# Patient Record
Sex: Male | Born: 1980 | Race: Black or African American | Hispanic: No | Marital: Single | Smoking: Current every day smoker
Health system: Southern US, Community
[De-identification: ages and names within clinical notes are randomized; demographics above are authoritative.]

## PROBLEM LIST (undated history)

## (undated) DIAGNOSIS — F32A Depression, unspecified: Secondary | ICD-10-CM

## (undated) DIAGNOSIS — K219 Gastro-esophageal reflux disease without esophagitis: Secondary | ICD-10-CM

## (undated) DIAGNOSIS — F329 Major depressive disorder, single episode, unspecified: Secondary | ICD-10-CM

## (undated) DIAGNOSIS — F419 Anxiety disorder, unspecified: Secondary | ICD-10-CM

## (undated) DIAGNOSIS — G4700 Insomnia, unspecified: Secondary | ICD-10-CM

## (undated) DIAGNOSIS — M419 Scoliosis, unspecified: Secondary | ICD-10-CM

## (undated) HISTORY — DX: Insomnia, unspecified: G47.00

## (undated) HISTORY — DX: Scoliosis, unspecified: M41.9

## (undated) HISTORY — DX: Gastro-esophageal reflux disease without esophagitis: K21.9

## (undated) HISTORY — DX: Anxiety disorder, unspecified: F41.9

## (undated) HISTORY — DX: Depression, unspecified: F32.A

---
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
## 2002-01-14 ENCOUNTER — Emergency Department (HOSPITAL_COMMUNITY): Admission: EM | Admit: 2002-01-14 | Discharge: 2002-01-14 | Payer: Self-pay | Admitting: Emergency Medicine

## 2003-03-30 ENCOUNTER — Emergency Department (HOSPITAL_COMMUNITY): Admission: EM | Admit: 2003-03-30 | Discharge: 2003-03-30 | Payer: Self-pay

## 2004-03-22 ENCOUNTER — Emergency Department (HOSPITAL_COMMUNITY): Admission: EM | Admit: 2004-03-22 | Discharge: 2004-03-22 | Payer: Self-pay | Admitting: Emergency Medicine

## 2005-03-30 ENCOUNTER — Emergency Department (HOSPITAL_COMMUNITY): Admission: EM | Admit: 2005-03-30 | Discharge: 2005-03-30 | Payer: Self-pay | Admitting: Emergency Medicine

## 2005-09-13 ENCOUNTER — Emergency Department (HOSPITAL_COMMUNITY): Admission: EM | Admit: 2005-09-13 | Discharge: 2005-09-13 | Payer: Self-pay | Admitting: Emergency Medicine

## 2005-09-26 ENCOUNTER — Emergency Department (HOSPITAL_COMMUNITY): Admission: EM | Admit: 2005-09-26 | Discharge: 2005-09-26 | Payer: Self-pay | Admitting: Emergency Medicine

## 2005-09-28 ENCOUNTER — Emergency Department (HOSPITAL_COMMUNITY): Admission: EM | Admit: 2005-09-28 | Discharge: 2005-09-28 | Payer: Self-pay | Admitting: Emergency Medicine

## 2006-01-14 ENCOUNTER — Emergency Department (HOSPITAL_COMMUNITY): Admission: EM | Admit: 2006-01-14 | Discharge: 2006-01-14 | Payer: Self-pay | Admitting: Emergency Medicine

## 2006-07-08 ENCOUNTER — Emergency Department (HOSPITAL_COMMUNITY): Admission: EM | Admit: 2006-07-08 | Discharge: 2006-07-08 | Payer: Self-pay | Admitting: Emergency Medicine

## 2008-10-28 ENCOUNTER — Emergency Department (HOSPITAL_COMMUNITY): Admission: EM | Admit: 2008-10-28 | Discharge: 2008-10-28 | Payer: Self-pay | Admitting: Emergency Medicine

## 2010-05-18 LAB — ETHANOL

## 2010-05-18 LAB — CBC
HCT: 39.9 % (ref 39.0–52.0)
Hemoglobin: 13.9 g/dL (ref 13.0–17.0)
MCHC: 34.8 g/dL (ref 30.0–36.0)
MCV: 88.3 fL (ref 78.0–100.0)
Platelets: 288 K/uL (ref 150–400)
RBC: 4.51 MIL/uL (ref 4.22–5.81)
RDW: 12.9 % (ref 11.5–15.5)
WBC: 8.3 K/uL (ref 4.0–10.5)

## 2010-05-18 LAB — COMPREHENSIVE METABOLIC PANEL
ALT: 16 U/L (ref 0–53)
AST: 21 U/L (ref 0–37)
Alkaline Phosphatase: 45 U/L (ref 39–117)
CO2: 32 mEq/L (ref 19–32)
Chloride: 101 mEq/L (ref 96–112)
Creatinine, Ser: 1.22 mg/dL (ref 0.4–1.5)
GFR calc Af Amer: >60 mL/min (ref >60)
GFR calc non Af Amer: >60 mL/min (ref >60)
Potassium: 3.5 mEq/L (ref 3.5–5.1)
Total Bilirubin: 0.7 mg/dL (ref 0.3–1.2)

## 2010-05-18 LAB — DIFFERENTIAL
Basophils Absolute: 0 10*3/uL (ref 0.0–0.1)
Basophils Relative: 0 % (ref 0–1)
Eosinophils Absolute: 0.5 10*3/uL (ref 0.0–0.7)
Eosinophils Relative: 6 % — ABNORMAL HIGH (ref 0–5)
Lymphocytes Relative: 23 % (ref 12–46)
Monocytes Absolute: 0.6 10*3/uL (ref 0.1–1.0)

## 2010-05-18 LAB — LIPASE, BLOOD: Lipase: 23 U/L (ref 11–59)

## 2010-12-21 ENCOUNTER — Encounter: Payer: Self-pay | Admitting: Emergency Medicine

## 2010-12-21 ENCOUNTER — Emergency Department (HOSPITAL_COMMUNITY)
Admission: EM | Admit: 2010-12-21 | Discharge: 2010-12-21 | Disposition: A | Discharge status: Home / Self Care | Payer: Medicaid Other | Attending: Emergency Medicine | Admitting: Emergency Medicine

## 2010-12-21 DIAGNOSIS — H60399 Other infective otitis externa, unspecified ear: Secondary | ICD-10-CM | POA: Insufficient documentation

## 2010-12-21 DIAGNOSIS — F17200 Nicotine dependence, unspecified, uncomplicated: Secondary | ICD-10-CM | POA: Insufficient documentation

## 2010-12-21 DIAGNOSIS — L0291 Cutaneous abscess, unspecified: Secondary | ICD-10-CM

## 2010-12-21 MED ORDER — TRAMADOL HCL 50 MG PO TABS
100.0000 mg | ORAL_TABLET | Freq: Once | ORAL | Status: AC
  Administered 2010-12-21: 100 mg via ORAL
  Filled 2010-12-21: qty 2

## 2010-12-21 MED ORDER — DOXYCYCLINE HYCLATE 100 MG PO CAPS: 100.0000 mg | ORAL_CAPSULE | Freq: Two times a day (BID) | ORAL | Status: AC

## 2010-12-21 MED ORDER — IBUPROFEN 800 MG PO TABS
800.0000 mg | ORAL_TABLET | Freq: Once | ORAL | Status: AC
  Administered 2010-12-21: 800 mg via ORAL
  Filled 2010-12-21: qty 1

## 2010-12-21 MED ORDER — TRAMADOL HCL 50 MG PO TABS: ORAL_TABLET | ORAL | Status: DC

## 2010-12-21 MED ORDER — DOXYCYCLINE HYCLATE 100 MG PO TABS
100.0000 mg | ORAL_TABLET | Freq: Once | ORAL | Status: AC
  Administered 2010-12-21: 100 mg via ORAL
  Filled 2010-12-21: qty 1

## 2010-12-21 NOTE — ED Provider Notes (Signed)
History     CSN: [id number] Arrival date & time: 12/21/2010 11:04 AM   First MD Initiated Contact with Patient 12/21/10 1111      Chief Complaint  Patient presents with  . Cyst    (Consider location/radiation/quality/duration/timing/severity/associated sxs/prior treatment) HPI Comments: Pt has an abscess behind his R ear which is already draining.  The history is provided by the patient. No language interpreter was used.    History reviewed. No pertinent past medical history.  History reviewed. No pertinent past surgical history.  No family history on file.  History  Substance Use Topics  . Smoking status: Current Everyday Smoker  . Smokeless tobacco: Not on file  . Alcohol Use: Yes     occasionally      Review of Systems  Constitutional: Negative for fever.  HENT: Positive for ear pain.   All other systems reviewed and are negative.    Allergies  Review of patient's allergies indicates no known allergies.  Home Medications  No current outpatient prescriptions on file.  BP 140/86  Pulse 82  Temp(Src) 97.6 F (36.4 C) (Oral)  Resp 18  Ht 5\' 9"  (1.753 m)  Wt 140 lb (63.504 kg)  BMI 20.67 kg/m2  SpO2 100%  Physical Exam  Nursing note and vitals reviewed. Constitutional: He is oriented to person, place, and time. He appears well-developed and well-nourished.  HENT:  Head: Normocephalic and atraumatic.  Right Ear: Hearing, tympanic membrane and ear canal normal. There is drainage and swelling.  Left Ear: Hearing, tympanic membrane, external ear and ear canal normal.  Ears:  Eyes: EOM are normal.  Neck: Normal range of motion.  Cardiovascular: Normal rate, regular rhythm, normal heart sounds and intact distal pulses.   Pulmonary/Chest: Effort normal and breath sounds normal. No respiratory distress.  Abdominal: Soft. He exhibits no distension. There is no tenderness.  Musculoskeletal: Normal range of motion.  Neurological: He is alert and oriented to  person, place, and time.  Skin: Skin is warm and dry.  Psychiatric: He has a normal mood and affect. Judgment normal.    ED Course  Procedures (including critical care time)  Labs Reviewed - No data to display No results found.   No diagnosis found.    MDM          Worthy Rancher, PA 12/21/10 [phone]

## 2010-12-21 NOTE — ED Notes (Signed)
Pt states he has a "knot" behind his right ear.  Pt states he has had this before.  He states he had another one behind same ear that ruptured on its own.  Pt denies hearing changes.  Pt states it is draining pus and blood.

## 2010-12-21 NOTE — ED Notes (Signed)
Pt a/ox4. Resp even and unlabored. NAD at this time. D/C instructions reviewed with pt. Pt verbalized understanding. Pt ambulated to POV with steady gate. 

## 2010-12-24 NOTE — ED Provider Notes (Signed)
Medical screening examination/treatment/procedure(s) were performed by non-physician practitioner and as supervising physician I was immediately available for consultation/collaboration.  Nicoletta Dress. Colon Branch, MD 12/24/10 1755

## 2013-06-29 ENCOUNTER — Ambulatory Visit (HOSPITAL_COMMUNITY)
Admission: RE | Admit: 2013-06-29 | Discharge: 2013-06-29 | Disposition: A | Discharge status: Home / Self Care | Payer: Medicaid Other | Source: Ambulatory Visit | Attending: Family Medicine | Admitting: Family Medicine

## 2013-06-29 DIAGNOSIS — R072 Precordial pain: Secondary | ICD-10-CM

## 2013-06-29 DIAGNOSIS — R079 Chest pain, unspecified: Secondary | ICD-10-CM | POA: Insufficient documentation

## 2013-06-29 DIAGNOSIS — F17200 Nicotine dependence, unspecified, uncomplicated: Secondary | ICD-10-CM | POA: Insufficient documentation

## 2013-06-29 NOTE — Progress Notes (Signed)
  Echocardiogram 2D Echocardiogram has been performed.  Tim Hahn 06/29/2013, 10:12 AM

## 2014-11-09 ENCOUNTER — Other Ambulatory Visit (HOSPITAL_COMMUNITY): Payer: Self-pay | Admitting: Adult Health Nurse Practitioner

## 2014-11-09 ENCOUNTER — Ambulatory Visit (HOSPITAL_COMMUNITY)
Admission: RE | Admit: 2014-11-09 | Discharge: 2014-11-09 | Disposition: A | Discharge status: Home / Self Care | Payer: Medicaid Other | Source: Ambulatory Visit | Attending: Adult Health Nurse Practitioner | Admitting: Adult Health Nurse Practitioner

## 2014-11-09 DIAGNOSIS — M546 Pain in thoracic spine: Secondary | ICD-10-CM | POA: Insufficient documentation

## 2014-11-09 DIAGNOSIS — M545 Low back pain, unspecified: Secondary | ICD-10-CM

## 2014-11-21 ENCOUNTER — Ambulatory Visit: Payer: Medicaid Other | Attending: Family Medicine | Admitting: Physical Therapy

## 2014-11-21 DIAGNOSIS — M546 Pain in thoracic spine: Secondary | ICD-10-CM | POA: Diagnosis present

## 2014-11-21 NOTE — Therapy (Signed)
[facility] [address] [place], [place], [address] Phone: [phone]   Fax:  [phone]  Physical Therapy Evaluation  Patient Details  Name: Tim Hahn MRN: [id number] Date of Birth: [birth year] Referring Provider:  Joette Catching, MD  Encounter Date: 11/21/2014      PT End of Session - 11/21/14 1419    Visit Number 1   Number of Visits 1   Date for PT Re-Evaluation 11/21/14   PT Start Time 0154   PT Stop Time 0219   PT Time Calculation (min) 25 min   Activity Tolerance Patient tolerated treatment well   Behavior During Therapy [facility] for tasks assessed/performed      No past medical history on file.  No past surgical history on file.  There were no vitals filed for this visit.  Visit Diagnosis:  Left-sided thoracic back pain - Plan: PT plan of care cert/re-cert      Subjective Assessment - 11/21/14 1415    Subjective My back hurts all the time.   Limitations Sitting;Lifting;Standing   Currently in Pain? Yes   Pain Score 8    Pain Location Back   Pain Descriptors / Indicators Aching   Pain Type Chronic pain   Pain Onset More than a month ago   Pain Frequency Constant   Aggravating Factors  "Everything."   Pain Relieving Factors "Nothing."   Multiple Pain Sites Yes            [facility] PT Assessment - 11/21/14 0001    Assessment   Medical Diagnosis Scoliosis of thoracic spine.   Onset Date/Surgical Date --  Years.   Precautions   Precautions None   Restrictions   Weight Bearing Restrictions No   Balance Screen   Has the patient fallen in the past 6 months No   Has the patient had a decrease in activity level because of a fear of falling?  Yes   Is the patient reluctant to leave their home because of a fear of falling?  No   Home Environment   Living Environment Private residence   Prior Function   Level of Independence Independent   Posture/Postural Control   Posture/Postural Control Postural  limitations   Postural Limitations Rounded Shoulders;Forward head;Decreased lumbar lordosis;Increased thoracic kyphosis   Posture Comments Mild thoracic scoliosis.   ROM / Strength   AROM / PROM / Strength AROM   AROM   Overall AROM Comments Spinal flexion limited by 75% and deviation towarrd right.   Palpation   Palpation comment Tender from mid-thoracic to lumbar spine on left over the paraspinal musculature.   Special Tests    Special Tests --  Normal bilateral LE DTR's.                                PT Long Term Goals - 11/21/14 1422    PT LONG TERM GOAL #1   Title Evaluation only.   Time --  Evaluation only.               Plan - 11/21/14 1419    Clinical Impression Statement The patient reports a long history of back pain.  He states that last medication he was on has not helped a all.  His pain-level is a 7-8/10.  He states that nothing seems to help decrease his pain.   Pt will benefit from skilled therapeutic intervention in order to improve on the following  deficits Pain   Rehab Potential Fair   PT Frequency --  Evaluation only.   Consulted and Agree with Plan of Care Patient         Problem List There are no active problems to display for this patient.   Sinahi Knights, [place] MPT 11/21/2014, 2:29 PM  [facility] [address] [place], [place], [address] Phone: [phone]   Fax:  [phone]

## 2015-04-25 NOTE — Therapy (Signed)
[facility] [place], [place], [address] Phone: [phone]   Fax:  [phone]  Physical Therapy Treatment  Patient Details  Name: Tim Hahn MRN: [id number] Date of Birth: Apr 30, 1980 No Data Recorded  Encounter Date: 11/21/2014    No past medical history on file.  No past surgical history on file.  There were no vitals filed for this visit.  Visit Diagnosis:  Left-sided thoracic back pain - Plan: PT plan of care cert/re-cert                                    PT Long Term Goals - 11/21/14 1422    PT LONG TERM GOAL #1   Title Evaluation only.   Time --  Evaluation only.               Problem List There are no active problems to display for this patient. PHYSICAL THERAPY DISCHARGE SUMMARY  Visits from Start of Care: 1  Current functional level related to goals / functional outcomes: Please see above.   Remaining deficits: Eval only.   Education / Equipment:  Plan: Patient agrees to discharge.  Patient goals were met. Patient is being discharged due to meeting the stated rehab goals.  ?????       Shulem Mader, [place] MPT 04/25/2015, 6:59 PM  [facility] [address] [place], [place], [address] Phone: [phone]   Fax:  [phone]  Name: Tim Hahn MRN: [id number] Date of Birth: Jun 28, 1980

## 2015-07-14 ENCOUNTER — Other Ambulatory Visit (HOSPITAL_COMMUNITY): Payer: Self-pay | Admitting: Adult Health Nurse Practitioner

## 2015-07-14 DIAGNOSIS — G8929 Other chronic pain: Secondary | ICD-10-CM

## 2015-07-14 DIAGNOSIS — M5440 Lumbago with sciatica, unspecified side: Principal | ICD-10-CM

## 2015-07-24 ENCOUNTER — Ambulatory Visit (HOSPITAL_COMMUNITY)
Admission: RE | Admit: 2015-07-24 | Discharge: 2015-07-24 | Disposition: A | Discharge status: Home / Self Care | Payer: Medicaid Other | Source: Ambulatory Visit | Attending: Adult Health Nurse Practitioner | Admitting: Adult Health Nurse Practitioner

## 2015-07-24 DIAGNOSIS — M5440 Lumbago with sciatica, unspecified side: Secondary | ICD-10-CM | POA: Insufficient documentation

## 2015-07-24 DIAGNOSIS — M5136 Other intervertebral disc degeneration, lumbar region: Secondary | ICD-10-CM | POA: Diagnosis not present

## 2015-07-24 DIAGNOSIS — G8929 Other chronic pain: Secondary | ICD-10-CM | POA: Diagnosis not present

## 2015-07-31 ENCOUNTER — Ambulatory Visit: Payer: Medicaid Other | Attending: Family Medicine | Admitting: Physical Therapy

## 2015-07-31 DIAGNOSIS — M545 Low back pain, unspecified: Secondary | ICD-10-CM

## 2015-07-31 NOTE — Patient Instructions (Signed)
  Hamstring Stretch, Reclined (Strap, Doorframe)   Lengthen bottom leg on floor. Extend top leg along edge of doorframe or press foot up into yoga strap. Hold for 30 seconds. Repeat 3_ times each leg.  Sit and Reach - Foot Supported   Remove leg supports. Place one foot on table. Straighten leg and attempt to keep it straight. Hold 30___ seconds. Repeat _3__ times each leg, alternating. Do _2-3__ sessions per day. Advanced: Reach forward and hold ___ seconds.    Place hands in small of back. Using hands as fulcrum, arch backward. Try to keep knees straight. Great exercise if sitting makes pain worse. Use to break up long periods of sitting. Repeat 10 times. Do __3-4__ sessions per day.  Tim PalmJulie Ariyanna Hahn, PT 07/31/2015 2:14 PM [facility] [address] [place], [place], [id number] Phone: [phone]   Fax:  [phone]

## 2015-07-31 NOTE — Therapy (Signed)
[facility] [place], [place], [address] Phone: [phone]   Fax:  [phone]  Physical Therapy Evaluation  Patient Details  Name: Tim Hahn MRN: [id number] Date of Birth: 01/02/1981 Referring Provider: Lars Mage, MD  Encounter Date: 07/31/2015      PT End of Session - 07/31/15 1415    Visit Number 1   Number of Visits 1   PT Start Time 2979   PT Stop Time 1415   PT Time Calculation (min) 28 min   Activity Tolerance Patient tolerated treatment well   Behavior During Therapy [facility] for tasks assessed/performed      No past medical history on file.  No past surgical history on file.  There were no vitals filed for this visit.       Subjective Assessment - 07/31/15 1355    Subjective Patient was in an accident when he was young and hurt his back. Since that time pain has been intermittent. It gets worse with a lot of bending.    How long can you sit comfortably? 15 min   Patient Stated Goals get rid of pain.   Currently in Pain? Yes   Pain Score 6    Pain Location Back   Pain Orientation Left   Pain Descriptors / Indicators Sharp   Pain Type Chronic pain   Pain Radiating Towards occassionally to abdomen   Pain Onset More than a month ago   Pain Frequency Intermittent   Aggravating Factors  bending and lifting   Pain Relieving Factors rest, standing            [facility] PT Assessment - 07/31/15 0001    Assessment   Medical Diagnosis DDD Lumbar   Referring Provider Lars Mage   Onset Date/Surgical Date 02/11/13   Next MD Visit not scheduled   Balance Screen   Has the patient fallen in the past 6 months No   Has the patient had a decrease in activity level because of a fear of falling?  No   Is the patient reluctant to leave their home because of a fear of falling?  No   Prior Function   Level of Independence Independent   Vocation childcare   ROM / Strength   AROM / PROM / Strength  AROM;Strength   AROM   Overall AROM Comments Flex limited 75%, else WNL.   Strength   Overall Strength Comments L hip flex, knee ext and ankle DF 5/5; R 4+/5   Palpation   Spinal mobility lumbar WNL   Palpation comment unremarkable   Special Tests    Special Tests --  negative SLR B                           PT Education - 07/31/15 1417    Education provided Yes   Education Details HEP and education on discs and avoiding forward bending, using good body mechanics, and sitting posture   Person(s) Educated Patient   Methods Explanation;Demonstration;Handout   Comprehension Verbalized understanding;Returned demonstration          PT Short Term Goals - 07/31/15 1421    PT SHORT TERM GOAL #1   Title I with HEP   Time 1   Period Days   Status Achieved                  Plan - 07/31/15 1417    Clinical Impression Statement Patient presents with  LBP aggravated by sitting and bending which affects his ability to perform ADLS. He has marked tightness in B hamstrings limiting forward flexion, but otherwise lumbar ROM WNL as is lumbar spine mobility. Patient is limited  to a one time visit by Medicaid, but was educated in disc mechanics and what to avoid.   Rehab Potential Excellent   PT Frequency One time visit   PT Treatment/Interventions ADLs/Self Care Home Management;Therapeutic exercise   PT Next Visit Plan see d/c summary   PT Home Exercise Plan hamstring stretch, standing extension   Consulted and Agree with Plan of Care Patient      Patient will benefit from skilled therapeutic intervention in order to improve the following deficits and impairments:  Pain  Visit Diagnosis: Bilateral low back pain without sciatica - Plan: PT plan of care cert/re-cert     Problem List There are no active problems to display for this patient.  Almyra Free Dimetri Armitage PT  07/31/2015, 2:29 PM  [facility] [address] [place], [place], [address] Phone: [phone]   Fax:  [phone]  Name: Tim Hahn MRN: [id number] Date of Birth: 10/11/1980   PHYSICAL THERAPY DISCHARGE SUMMARY  Visits from Start of Care:1  Current functional level related to goals / functional outcomes: See above   Remaining deficits: See above   Education / Equipment: HEP  Plan: Patient agrees to discharge.  Patient goals were met. Patient is being discharged due to financial reasons.  ?????       Madelyn Flavors, PT 07/31/2015 2:29 PM [facility] [place], [place], [address] Phone: [phone]   Fax:  [phone]

## 2017-05-24 ENCOUNTER — Encounter (HOSPITAL_COMMUNITY): Payer: Self-pay | Admitting: Emergency Medicine

## 2017-05-24 ENCOUNTER — Emergency Department (HOSPITAL_COMMUNITY)
Admission: EM | Admit: 2017-05-24 | Discharge: 2017-05-24 | Disposition: A | Discharge status: Home / Self Care | Payer: Medicaid Other | Attending: Emergency Medicine | Admitting: Emergency Medicine

## 2017-05-24 ENCOUNTER — Other Ambulatory Visit: Payer: Self-pay

## 2017-05-24 ENCOUNTER — Emergency Department (HOSPITAL_COMMUNITY): Payer: Medicaid Other

## 2017-05-24 DIAGNOSIS — Y999 Unspecified external cause status: Secondary | ICD-10-CM | POA: Diagnosis not present

## 2017-05-24 DIAGNOSIS — Y9383 Activity, rough housing and horseplay: Secondary | ICD-10-CM | POA: Diagnosis not present

## 2017-05-24 DIAGNOSIS — Y929 Unspecified place or not applicable: Secondary | ICD-10-CM | POA: Diagnosis not present

## 2017-05-24 DIAGNOSIS — X500XXA Overexertion from strenuous movement or load, initial encounter: Secondary | ICD-10-CM | POA: Diagnosis not present

## 2017-05-24 DIAGNOSIS — S20212A Contusion of left front wall of thorax, initial encounter: Secondary | ICD-10-CM

## 2017-05-24 DIAGNOSIS — F17200 Nicotine dependence, unspecified, uncomplicated: Secondary | ICD-10-CM | POA: Diagnosis not present

## 2017-05-24 DIAGNOSIS — S299XXA Unspecified injury of thorax, initial encounter: Secondary | ICD-10-CM | POA: Diagnosis present

## 2017-05-24 NOTE — ED Provider Notes (Signed)
[facility] EMERGENCY DEPARTMENT Provider Note   CSN: [id number] Arrival date & time: 05/24/17  1421     History   Chief Complaint Chief Complaint  Patient presents with  . Rib Injury    HPI Tim Hahn is a 37 y.o. male.  Patient is a 37 year old male who presents to the emergency department with a complaint of left rib area pain.  Patient states this problem started about 4 days ago.  The patient states he was playing and roughhousing with his brother, and injured the left lower chest area.  The pain is been getting progressively worse.  No hemoptysis reported.  No fever or chills reported.  No recent operations or procedures were appreciated.  Patient states at times he has pain with taking a deep breath and with certain movements.  He is not take any medication for this up to this point.  He presents to the emergency department because the pain is getting worse instead of getting better.     History reviewed. No pertinent past medical history.  There are no active problems to display for this patient.   History reviewed. No pertinent surgical history.      Home Medications    Prior to Admission medications   Medication Sig Start Date End Date Taking? Authorizing Provider  traMADol (ULTRAM) 50 MG tablet Maximum dose= 8 tablets per day.  1-2 tabs po q 6 hrs prn pain 12/21/10   Worthy RancherMiller, Richard M, PA-C    Family History History reviewed. No pertinent family history.  Social History Social History   Tobacco Use  . Smoking status: Current Every Day Smoker  . Smokeless tobacco: Never Used  Substance Use Topics  . Alcohol use: Yes    Comment: occasionally  . Drug use: No     Allergies   Patient has no known allergies.   Review of Systems Review of Systems  Constitutional: Negative for activity change.       All ROS Neg except as noted in HPI  HENT: Negative for nosebleeds.   Eyes: Negative for photophobia and discharge.  Respiratory: Negative for  cough, shortness of breath and wheezing.        Chest wall pain  Cardiovascular: Negative for chest pain and palpitations.  Gastrointestinal: Negative for abdominal pain and blood in stool.  Genitourinary: Negative for dysuria, frequency and hematuria.  Musculoskeletal: Negative for arthralgias, back pain and neck pain.  Skin: Negative.   Neurological: Negative for dizziness, seizures and speech difficulty.  Psychiatric/Behavioral: Negative for confusion and hallucinations.     Physical Exam Updated Vital Signs BP (!) 137/93 (BP Location: Right Arm)   Pulse 76   Temp 98.4 F (36.9 C) (Oral)   Resp 19   Ht 5\' 9"  (1.753 m)   Wt 83.9 kg (185 lb)   SpO2 100%   BMI 27.32 kg/m   Physical Exam  Constitutional: He is oriented to person, place, and time. He appears well-developed and well-nourished.  Non-toxic appearance.  HENT:  Head: Normocephalic.  Right Ear: Tympanic membrane and external ear normal.  Left Ear: Tympanic membrane and external ear normal.  Eyes: Pupils are equal, round, and reactive to light. EOM and lids are normal.  Neck: Normal range of motion. Neck supple. Carotid bruit is not present.  Cardiovascular: Normal rate, regular rhythm, normal heart sounds, intact distal pulses and normal pulses.  Pulmonary/Chest: Breath sounds normal. No respiratory distress. He exhibits tenderness.  Patient speaks in complete sentences without problem.  Abdominal: Soft. Bowel sounds are normal. There is no tenderness. There is no guarding.  Musculoskeletal: Normal range of motion.  Lymphadenopathy:       Head (right side): No submandibular adenopathy present.       Head (left side): No submandibular adenopathy present.    He has no cervical adenopathy.  Neurological: He is alert and oriented to person, place, and time. He has normal strength. No cranial nerve deficit or sensory deficit.  Skin: Skin is warm and dry.  Psychiatric: He has a normal mood and affect. His speech is  normal.  Nursing note and vitals reviewed.    ED Treatments / Results  Labs (all labs ordered are listed, but only abnormal results are displayed) Labs Reviewed - No data to display  EKG None  Radiology Dg Ribs Unilateral W/chest Left  Result Date: 05/24/2017 CLINICAL DATA:  Injury while wrestling EXAM: LEFT RIBS AND CHEST - 3+ VIEW COMPARISON:  Chest radiograph March 30, 2003 FINDINGS: Frontal chest as well as oblique and cone-down rib images were obtained. Lungs are clear. Heart size and pulmonary vascularity are normal. No adenopathy. No pneumothorax or pleural effusion. No evident rib fracture. IMPRESSION: No demonstrable rib fracture.  Lungs clear. Electronically Signed   By: Bretta Bang III M.D.   On: 05/24/2017 15:10    Procedures Procedures (including critical care time)  Medications Ordered in ED Medications - No data to display   Initial Impression / Assessment and Plan / ED Course  I have reviewed the triage vital signs and the nursing notes.  Pertinent labs & imaging results that were available during my care of the patient were reviewed by me and considered in my medical decision making (see chart for details).       Final Clinical Impressions(s) / ED Diagnoses MDm  Vital signs reviewed.  Pulse oximetry is 100% on room air.  Patient speaks in complete sentences without problem. Chest x-ray is negative for any rib or other bony abnormality.  Lungs are clear.  No bruising noted at the site of discomfort.   Patient will use Tylenol every 4 hours, or ibuprofen every 6 hours for soreness and discomfort.  The patient will see his primary physician or return to the emergency department if any changes or problems.   Final diagnoses:  Chest wall contusion, left, initial encounter    ED Discharge Orders    None       Ivery Quale, PA-C 05/25/17 1920    Samuel Jester, DO 05/31/17 1835

## 2017-05-24 NOTE — ED Triage Notes (Signed)
Pain in lt lower rib pain x 1 week, could have came from wrestling with his brother.  Worse when taking a deep breath

## 2017-05-24 NOTE — ED Notes (Signed)
HB in to evaluate  

## 2017-05-24 NOTE — Discharge Instructions (Addendum)
Your oxygen level is 100%.  Your pulse rate and respiratory rate are both within normal limits.  The x-ray of your chest is negative for any evidence of lung injury.  The x-ray of your ribs are also negative for fracture or dislocation.  Heating pad to the area may be helpful.  Please use Tylenol extra strength, or ibuprofen for soreness.  Please see Dr. Caprice KluverHamburg for additional evaluation if not improving.

## 2017-05-24 NOTE — ED Notes (Signed)
To rad 

## 2017-05-24 NOTE — ED Notes (Signed)
Pt reports rib pain for the last 4 days  Has taken no meds, nor seen PCP   Reports pain when lying on that side

## 2017-09-21 ENCOUNTER — Encounter (HOSPITAL_COMMUNITY): Payer: Self-pay | Admitting: Emergency Medicine

## 2017-09-21 ENCOUNTER — Emergency Department (HOSPITAL_COMMUNITY)
Admission: EM | Admit: 2017-09-21 | Discharge: 2017-09-21 | Disposition: A | Discharge status: Home / Self Care | Payer: Medicaid Other | Attending: Emergency Medicine | Admitting: Emergency Medicine

## 2017-09-21 DIAGNOSIS — L0201 Cutaneous abscess of face: Secondary | ICD-10-CM | POA: Insufficient documentation

## 2017-09-21 DIAGNOSIS — F17200 Nicotine dependence, unspecified, uncomplicated: Secondary | ICD-10-CM | POA: Diagnosis not present

## 2017-09-21 DIAGNOSIS — R220 Localized swelling, mass and lump, head: Secondary | ICD-10-CM | POA: Diagnosis present

## 2017-09-21 MED ORDER — LIDOCAINE-EPINEPHRINE (PF) 2 %-1:200000 IJ SOLN
10.0000 mL | Freq: Once | INTRAMUSCULAR | Status: AC
  Administered 2017-09-21: 10 mL via INTRADERMAL
  Filled 2017-09-21: qty 20

## 2017-09-21 MED ORDER — CLINDAMYCIN HCL 150 MG PO CAPS: 300.0000 mg | ORAL_CAPSULE | Freq: Three times a day (TID) | ORAL | 0 refills | Status: AC

## 2017-09-21 MED ORDER — OXYCODONE-ACETAMINOPHEN 5-325 MG PO TABS
1.0000 | ORAL_TABLET | Freq: Once | ORAL | Status: AC
  Administered 2017-09-21: 1 via ORAL
  Filled 2017-09-21: qty 1

## 2017-09-21 MED ORDER — CLINDAMYCIN HCL 150 MG PO CAPS
300.0000 mg | ORAL_CAPSULE | Freq: Once | ORAL | Status: AC
  Administered 2017-09-21: 300 mg via ORAL
  Filled 2017-09-21: qty 2

## 2017-09-21 NOTE — Discharge Instructions (Signed)
You had an abscess which was drained in the ER today.  Please gently wash with warm soapy water daily.  We will continue to ooze and leak for several days, this is normal.  Please take antibiotic 3 times a day until it is completely gone.  I have given you information to establish care with dermatology.  Please return to the ER if you have any new or concerning symptoms like worsening redness or swelling in your face, fever.

## 2017-09-21 NOTE — ED Provider Notes (Signed)
[facility]   CSN: [id number] Arrival date & time: 09/21/17  1226     History   Chief Complaint Chief Complaint  Patient presents with  . Abscess    HPI Theotis Barrioimothy B Kesinger is a 37 y.o. male.  HPI  Bradly Bienenstockimothy Carreto is 37yo male with no significant past medical history who presents to the emergency department for evaluation of facial abscess.  Patient reports that he noticed a raised area of swelling over his left cheek which has gradually increased in size over the last week. States that he noticed some yellow pus draining from the site yesterday.  Rates his pain is about a 6/10 in severity and throbbing at this time. He reports that he had his right upper wisdom tooth removed a week ago, is finishing a course of penicillin since that surgery. He denies any mouth or tooth pain.  He denies fevers, chills, trismus, trouble breathing or swallowing, abdominal pain or vomiting.   History reviewed. No pertinent past medical history.  There are no active problems to display for this patient.   History reviewed. No pertinent surgical history.      Home Medications    Prior to Admission medications   Medication Sig Start Date End Date Taking? Authorizing Provider  traMADol (ULTRAM) 50 MG tablet Maximum dose= 8 tablets per day.  1-2 tabs po q 6 hrs prn pain 12/21/10   Worthy RancherMiller, Richard M, PA-C    Family History No family history on file.  Social History Social History   Tobacco Use  . Smoking status: Current Every Day Smoker  . Smokeless tobacco: Never Used  Substance Use Topics  . Alcohol use: Yes    Comment: occasionally  . Drug use: No     Allergies   Patient has no known allergies.   Review of Systems Review of Systems  Constitutional: Negative for chills and fever.  HENT: Positive for facial swelling (left cheek). Negative for dental problem and trouble swallowing.   Eyes: Negative for visual disturbance.    Respiratory: Negative for shortness of breath.   Gastrointestinal: Negative for abdominal pain, nausea and vomiting.  Skin: Positive for color change.  Neurological: Negative for headaches.     Physical Exam Updated Vital Signs BP 128/88 (BP Location: Left Arm)   Pulse 76   Temp 97.6 F (36.4 C) (Oral)   Resp 16   SpO2 100%   Physical Exam  Constitutional: He appears well-developed and well-nourished. No distress.  HENT:  Head: Normocephalic and atraumatic.  3cm x 4cm erythematous area of fluctuance over the left cheek which is tender to palpation. No active drainage. Dental cavities and poor oral dentition noted with several missing teeth. No tooth tenderness or visible abscess in the mouth. Midline uvula. No trismus. OP moist and clear. No oropharyngeal erythema or edema. Neck supple with no tenderness.   Eyes: Pupils are equal, round, and reactive to light. Conjunctivae are normal. Right eye exhibits no discharge. Left eye exhibits no discharge.  No periorbital swelling or tenderness.   Neck: Normal range of motion. Neck supple.  Pulmonary/Chest: Effort normal. No respiratory distress.  Neurological: He is alert. Coordination normal.  Skin: He is not diaphoretic.  Psychiatric: He has a normal mood and affect. His behavior is normal.  Nursing note and vitals reviewed.      ED Treatments / Results  Labs (all labs ordered are listed, but only abnormal results are displayed) Labs Reviewed - No data  to display  EKG None  Radiology No results found.  Procedures .Marland KitchenIncision and Drainage Date/Time: 09/21/2017 2:50 PM Performed by: Kellie Shropshire, PA-C Authorized by: Kellie Shropshire, PA-C   Consent:    Consent obtained:  Verbal   Consent given by:  Patient   Risks discussed:  Bleeding, incomplete drainage, infection and pain   Alternatives discussed:  No treatment and delayed treatment Location:    Type:  Abscess   Size:  3cm   Location:  Head   Head  location:  Face Pre-procedure details:    Skin preparation:  Betadine Anesthesia (see MAR for exact dosages):    Anesthesia method:  Local infiltration   Local anesthetic:  Lidocaine 2% WITH epi Procedure type:    Complexity:  Simple Procedure details:    Needle aspiration: no     Incision types:  Stab incision   Scalpel blade:  11   Wound management:  Probed and deloculated   Drainage:  Purulent and bloody   Drainage amount:  Moderate   Wound treatment:  Wound left open   Packing materials:  None Post-procedure details:    Patient tolerance of procedure:  Tolerated well, no immediate complications   (including critical care time)  EMERGENCY DEPARTMENT US SOFT TISSUE INTERPRETATION "Study: Limited Soft Tissue Ultrasound"  INDICATIONS: Pain and Soft tissue infection Multiple views of the body part were obtained in real-time with a multi-frequency linear probe  PERFORMED BY: Myself IMAGES ARCHIVED?: Yes SIDE:Left BODY PART:face INTERPRETATION:  Abcess present  Medications Ordered in ED Medications  oxyCODONE-acetaminophen (PERCOCET/ROXICET) 5-325 MG per tablet 1 tablet (has no administration in time range)  lidocaine-EPINEPHrine (XYLOCAINE W/EPI) 2 %-1:200000 (PF) injection 10 mL (10 mLs Intradermal Given 09/21/17 1432)    Initial Impression / Assessment and Plan / ED Course  I have reviewed the triage vital signs and the nursing notes.  Pertinent labs & imaging results that were available during my care of the patient were reviewed by me and considered in my medical decision making (see chart for details).     Presents with left cheek facial abscess.  It does not extend to the periorbital area and I do not have concern for preseptal cellulitis given exam findings.  No evidence of abscess inside the mouth.  Abscess was amenable to I&D in the emergency department.  It was not large enough to warrant packing or drain.  Will discharge home with clindamycin even surrounding  facial swelling. Counseled him on warm soaks at home.  Have given him information to establish care with dermatology.  Counseled him on return precautions and he agrees and appears reliable.  Final Clinical Impressions(s) / ED Diagnoses   Final diagnoses:  Abscess of face    ED Discharge Orders         Ordered    clindamycin (CLEOCIN) 150 MG capsule  3 times daily     09/21/17 1459           Lawrence Marseilles 09/21/17 1459    Eber Hong, MD 09/21/17 [phone]

## 2017-09-21 NOTE — ED Triage Notes (Signed)
Patient complains of abscess on left side of face that appeared three days ago. Denies other complaints. Reports having a tooth pulled recently on opposite side of mouth. Patient alert, oriented, and ambulating independently with steady gait.

## 2017-09-21 NOTE — ED Notes (Signed)
ED Provider at bedside. 

## 2018-09-26 IMAGING — DX DG RIBS W/ CHEST 3+V*L*
4 series · 4 of 4 positions shown · non-contrast
Comparison: Chest radiograph March 30, 2003

CLINICAL DATA: Injury while wrestling

EXAM:
LEFT RIBS AND CHEST - 3+ VIEW

[chest pa]
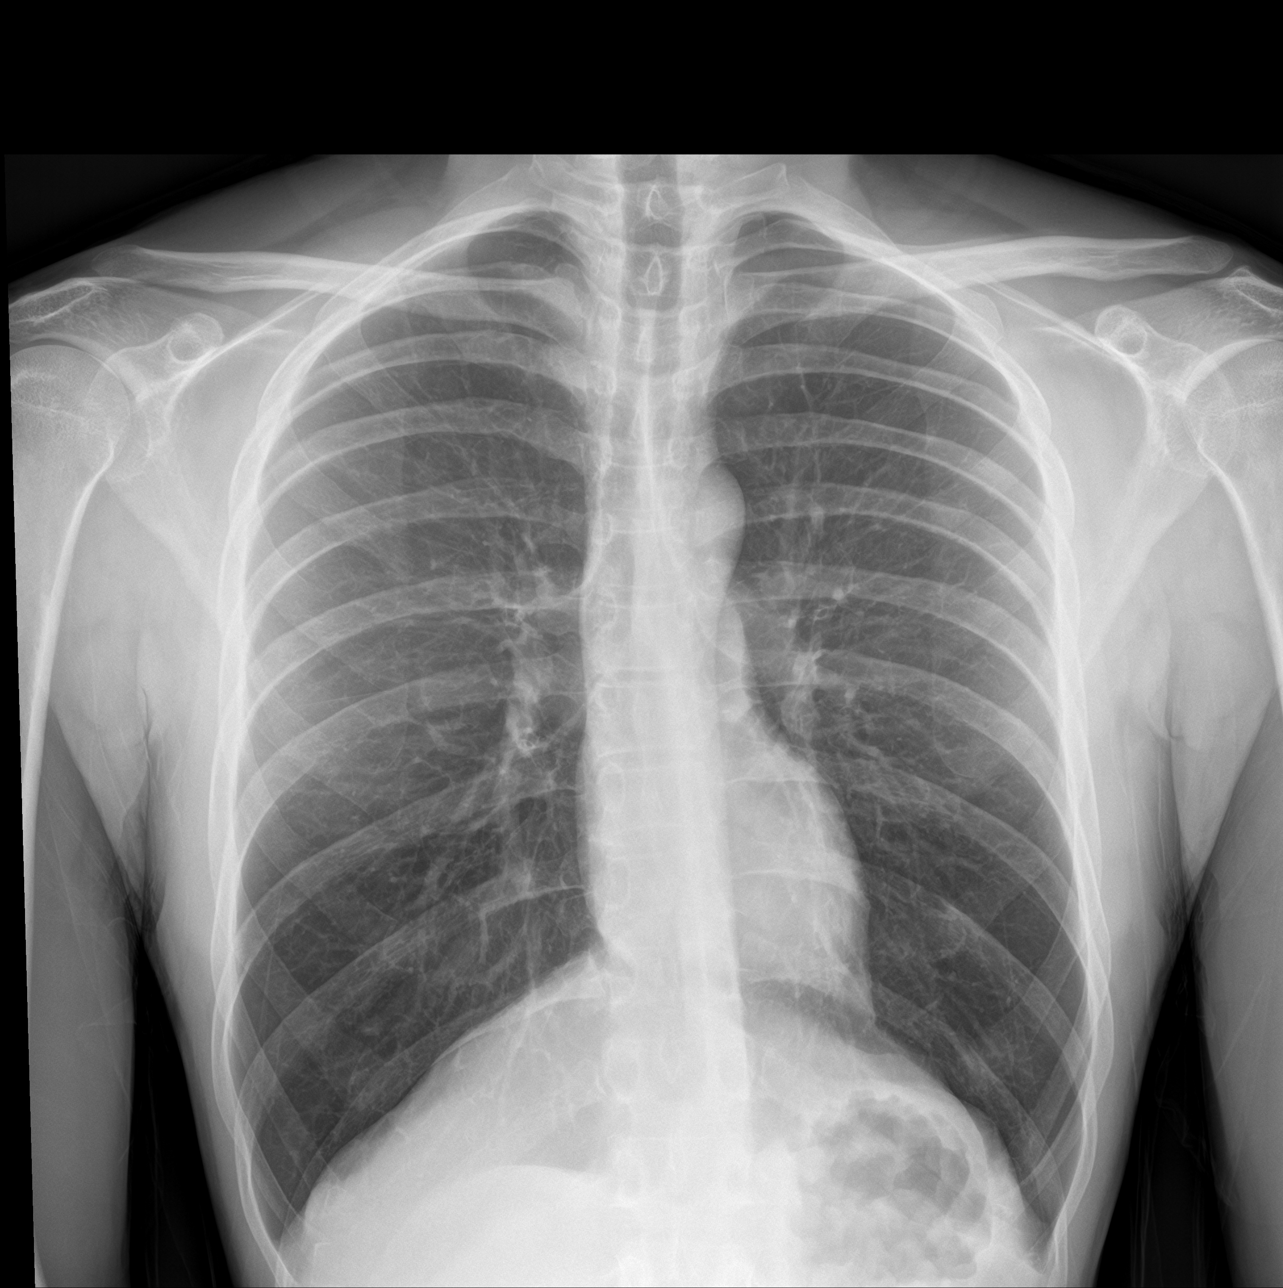

[rib pa obl (1 of 2)]
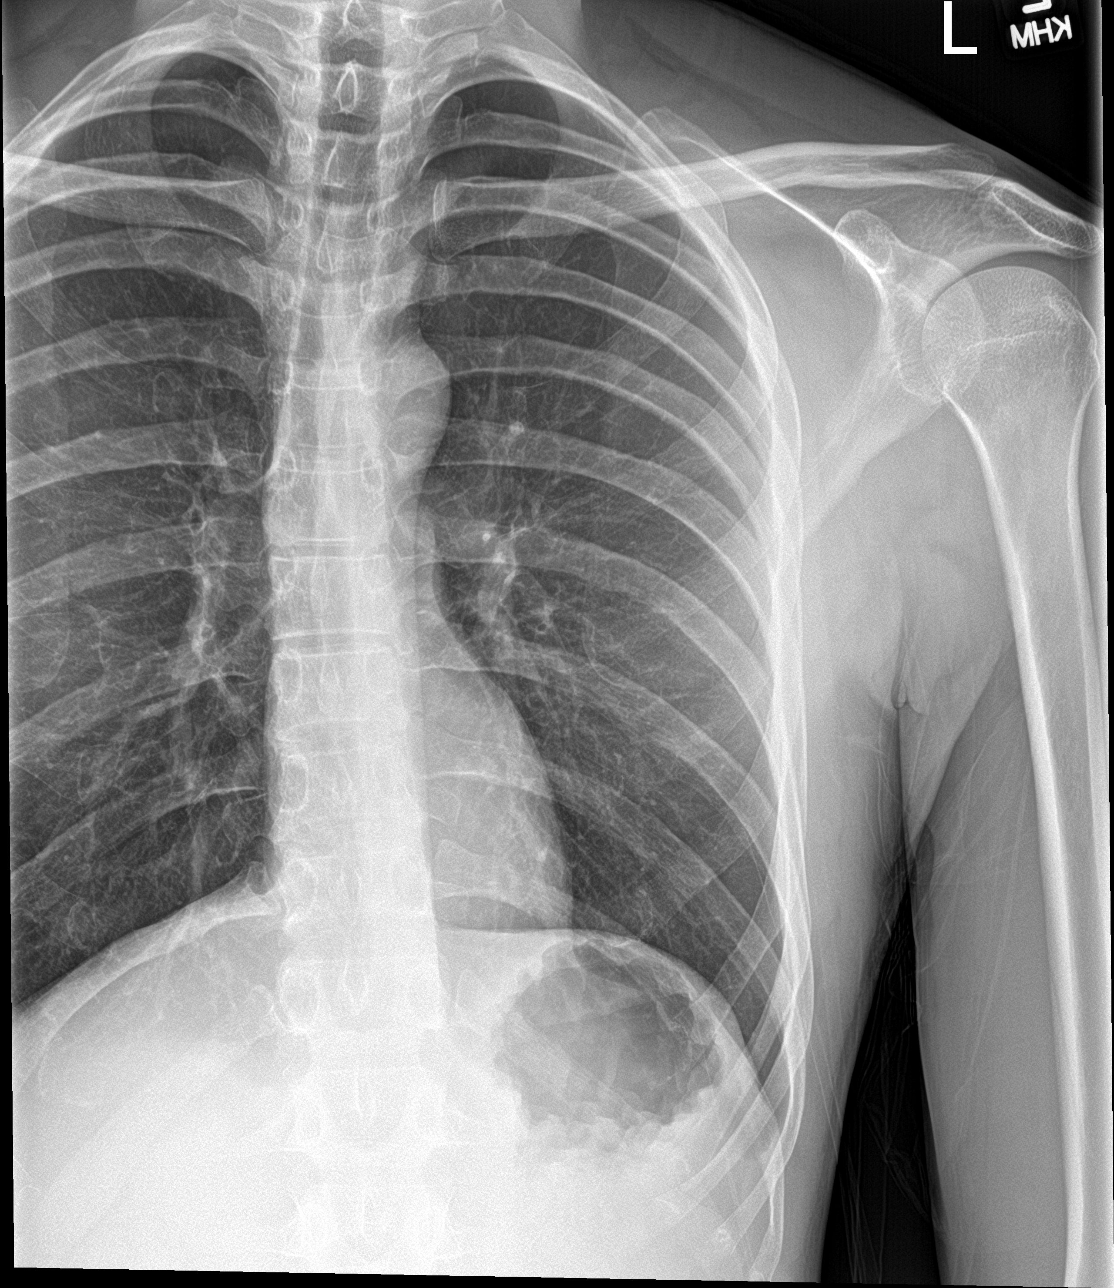

[rib pa obl (2 of 2)]
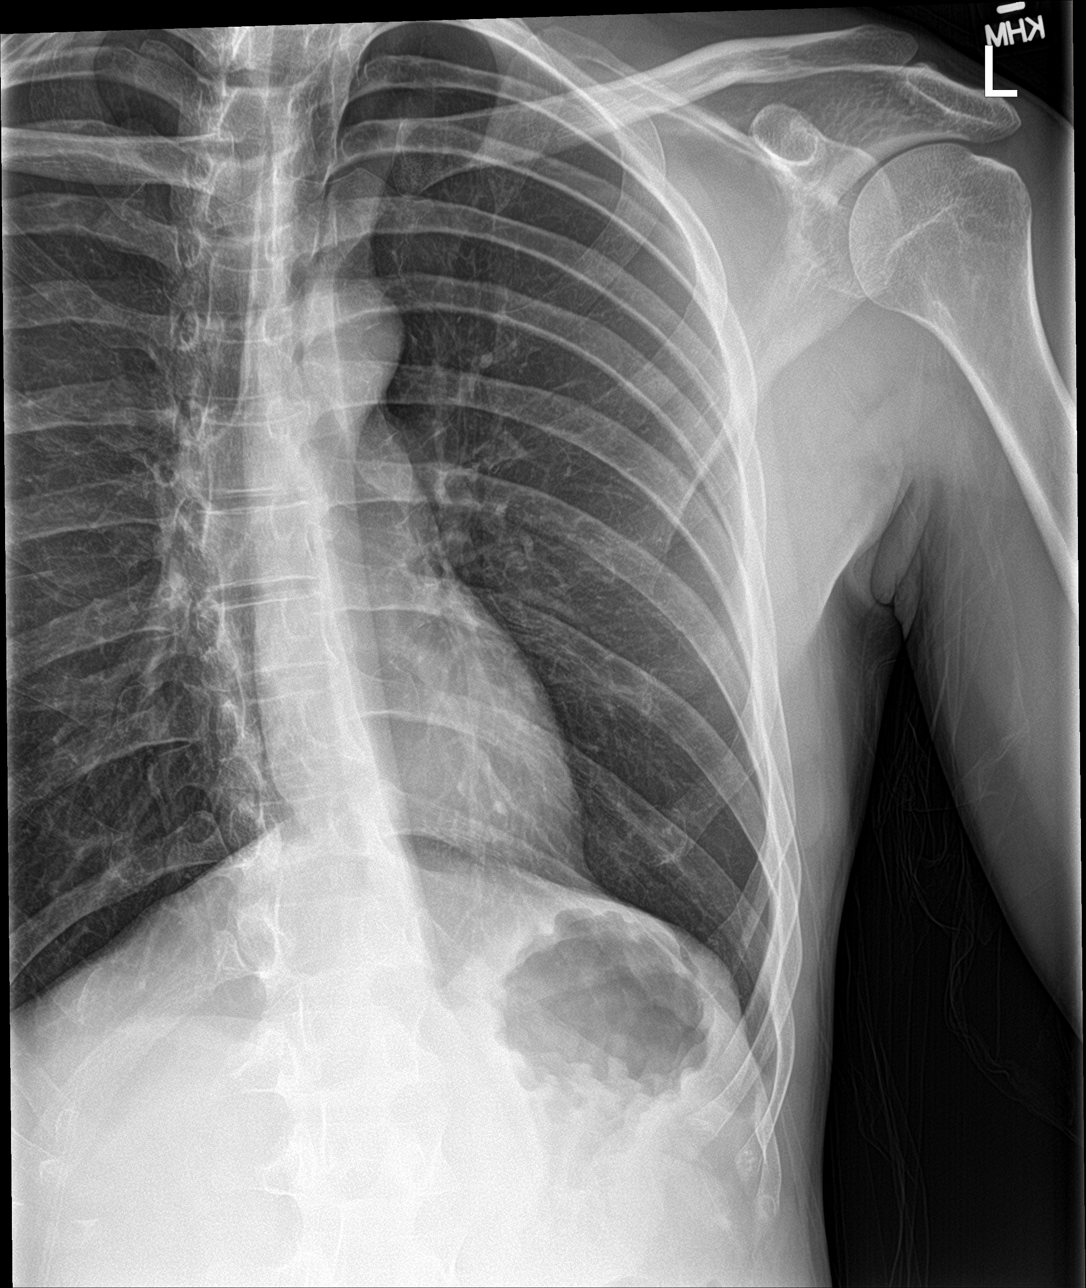

[rib pa]
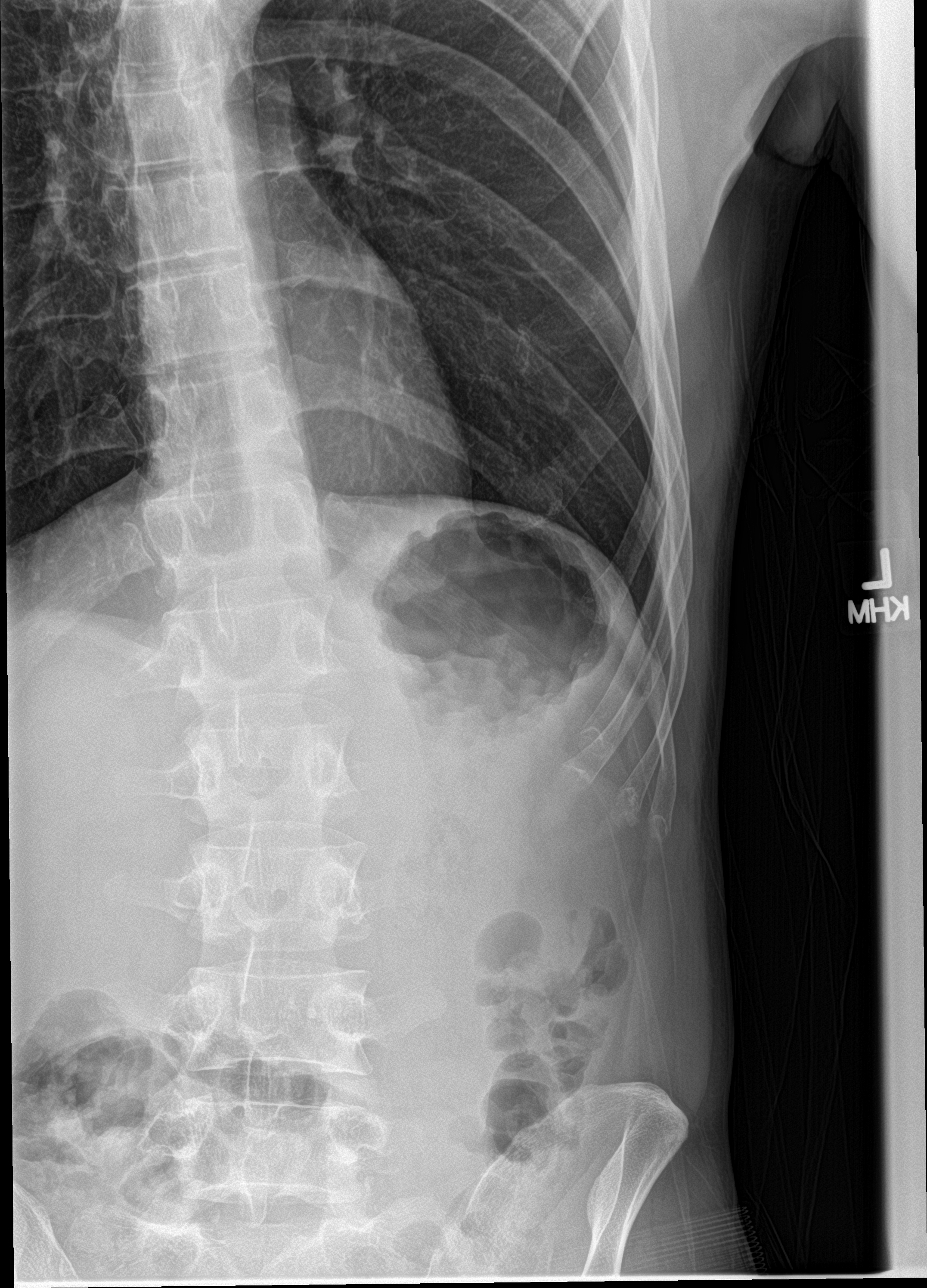

[4 of 4 positions shown; findings below may reference images not displayed]

FINDINGS: Frontal chest as well as oblique and cone-down rib images were
obtained. Lungs are clear. Heart size and pulmonary vascularity are
normal. No adenopathy. No pneumothorax or pleural effusion. No
evident rib fracture.
IMPRESSION: No demonstrable rib fracture.  Lungs clear.

## 2019-05-04 ENCOUNTER — Encounter: Payer: Self-pay | Admitting: Internal Medicine

## 2019-05-15 ENCOUNTER — Encounter: Payer: Self-pay | Admitting: Gastroenterology

## 2019-05-15 NOTE — Progress Notes (Signed)
Referring Provider: Adaline Sill, NP Primary Care Physician:  Adaline Sill, NP Primary Gastroenterologist:  Dr. Gala Romney  Chief Complaint  Patient presents with  . Gastroesophageal Reflux  . Rectal Bleeding    HPI:   Tim Hahn is a 39 y.o. male presenting today at the request of Adaline Sill, NP for GERD and rectal bleeding.  Reviewed PCP note dated 04/29/2019.  Patient reported 3-4 months of intermittent epigastric pain worsened with alcohol, aspirin, and stress.  Improved with esomeprazole.  Also with nausea and rectal bleeding.  He was to increase esomeprazole to 40 mg daily, stop meloxicam, and follow-up with GI for evaluation of GERD and rectal bleeding.  Today: GERD symptoms continue several days a week. Not taking Nexium. States his PCP didn't send his medication to the pharmacy. Taking tums as needed. States they make him sick. Spaghetti, pizza, and tacos worsen symptoms. Continues with symptoms even with avoiding these foods. Heartburn with drinking water. Also with acid into his throat. Will wake up at night with reflux. Eats within 3 hours of going to bed. No dysphagia. No nausea or vomiting. Was having upper abdominal pain the last couple of days. Comes and goes. No pain at this time.  Was cramping. Woke up with it. States he didn't eat anything. Not affected by BMs.   BMs every 3-4 days. This is his baseline. Has to strain at times. States sometimes he thinks he is using the bathroom but sometimes there is blood in the toilet. Blood is in the toilet water. Bright red. Comes and goes for "a while." 1-2 years. Can't get a clear history as to how often this occurs.  Occurs with straining.  Feels his stools are soft. No diarrhea. Thinks he probably has hemorrhoids. He has rectal burning at times. No knife like pain. Uses hemorrhoid cream at times. When asking about prolapsing tissue, he states "I guess so." No black stool.   Admits to unintentional weight loss.  States he used to be around 148 lbs. States it has been a long time since he has seen that, greater than 1 year. States he trying to gain weight but this is not working. Admits to lightheadedness and dizziness. Present for 6-7 months. States he had blood work with his PCP and was told his iron is low.  Started taking over-the-counter iron.  No chest pain. Occasional palpitations. No shortness of breath or cough.  Denies fever, chills, cold or flulike symptoms.  Denies NSAID use. Was on Meloxicam according to PCP note. Patient states he is unaware of how long he was on this.   States Nexium 20 had worked well in the past but if he missed a day his symptoms would be severe.   No prior TCS or EGD.   Past Medical History:  Diagnosis Date  . Anxiety   . Depression   . GERD (gastroesophageal reflux disease)   . Insomnia   . Scoliosis of thoracic spine     History reviewed. No pertinent surgical history.  Current Outpatient Medications  Medication Sig Dispense Refill  . ALPRAZolam (XANAX) 0.5 MG tablet Take 0.5 mg by mouth daily.    Marland Kitchen OVER THE COUNTER MEDICATION OTC iron daily    . CLENPIQ 10-3.5-12 MG-GM -GM/160ML SOLN Take 1 kit by mouth once for 1 dose. 320 mL 0  . pantoprazole (PROTONIX) 40 MG tablet Take 1 tablet (40 mg total) by mouth 2 (two) times daily. 60 tablet 1   No current  facility-administered medications for this visit.    Allergies as of 05/17/2019  . (No Known Allergies)    Family History  Problem Relation Age of Onset  . Colon cancer Neg Hx     Social History   Socioeconomic History  . Marital status: Single    Spouse name: Not on file  . Number of children: Not on file  . Years of education: Not on file  . Highest education level: Not on file  Occupational History  . Not on file  Tobacco Use  . Smoking status: Current Every Day Smoker    Types: Cigarettes  . Smokeless tobacco: Never Used  Substance and Sexual Activity  . Alcohol use: Yes    Comment:  occasionally  . Drug use: Yes    Types: Marijuana    Comment: daily  . Sexual activity: Not on file  Other Topics Concern  . Not on file  Social History Narrative  . Not on file   Social Determinants of Health   Financial Resource Strain:   . Difficulty of Paying Living Expenses:   Food Insecurity:   . Worried About Charity fundraiser in the Last Year:   . Arboriculturist in the Last Year:   Transportation Needs:   . Film/video editor (Medical):   Marland Kitchen Lack of Transportation (Non-Medical):   Physical Activity:   . Days of Exercise per Week:   . Minutes of Exercise per Session:   Stress:   . Feeling of Stress :   Social Connections:   . Frequency of Communication with Friends and Family:   . Frequency of Social Gatherings with Friends and Family:   . Attends Religious Services:   . Active Member of Clubs or Organizations:   . Attends Archivist Meetings:   Marland Kitchen Marital Status:   Intimate Partner Violence:   . Fear of Current or Ex-Partner:   . Emotionally Abused:   Marland Kitchen Physically Abused:   . Sexually Abused:     Review of Systems: Gen: See HPI CV: See HPI Resp: See HPI GI: See HPI GU : Denies urinary burning, urinary frequency, urinary hesitancy Derm: Denies rash Heme: See HPI  Physical Exam: BP (!) 132/93   Pulse 66   Temp (!) 97.1 F (36.2 C) (Temporal)   Ht 5' 9"  (1.753 m)   Wt 130 lb 6.4 oz (59.1 kg)   BMI 19.26 kg/m  General:   Alert and oriented. Pleasant and cooperative. Well-nourished and well-developed.  Head:  Normocephalic and atraumatic. Eyes:  Without icterus, sclera clear and conjunctiva pink.  Ears:  Normal auditory acuity. Lungs:  Clear to auscultation bilaterally. No wheezes, rales, or rhonchi. No distress.  Heart:  S1, S2 present without murmurs appreciated.  Abdomen:  +BS, soft, non-tender and non-distended. No HSM noted. No guarding or rebound. No masses appreciated.  Rectal: Declined Msk:  Symmetrical without gross  deformities. Normal posture. Extremities:  Without edema. Neurologic:  Alert and  oriented x4;  grossly normal neurologically. Skin:  Intact without significant lesions or rashes. Psych:  Normal mood and affect.

## 2019-05-17 ENCOUNTER — Encounter: Payer: Self-pay | Admitting: *Deleted

## 2019-05-17 ENCOUNTER — Encounter: Payer: Self-pay | Admitting: Gastroenterology

## 2019-05-17 ENCOUNTER — Other Ambulatory Visit: Payer: Self-pay

## 2019-05-17 ENCOUNTER — Ambulatory Visit (INDEPENDENT_AMBULATORY_CARE_PROVIDER_SITE_OTHER): Payer: Medicaid Other | Admitting: Gastroenterology

## 2019-05-17 VITALS — BP 132/93 | HR 66 | Temp 97.1°F | Ht 69.0 in | Wt 130.4 lb

## 2019-05-17 DIAGNOSIS — K219 Gastro-esophageal reflux disease without esophagitis: Secondary | ICD-10-CM | POA: Insufficient documentation

## 2019-05-17 DIAGNOSIS — K5900 Constipation, unspecified: Secondary | ICD-10-CM

## 2019-05-17 DIAGNOSIS — K625 Hemorrhage of anus and rectum: Secondary | ICD-10-CM | POA: Diagnosis not present

## 2019-05-17 MED ORDER — CLENPIQ 10-3.5-12 MG-GM -GM/160ML PO SOLN: 1.0000 | Freq: Once | ORAL | 0 refills | Status: AC

## 2019-05-17 MED ORDER — PANTOPRAZOLE SODIUM 40 MG PO TBEC: 40.0000 mg | DELAYED_RELEASE_TABLET | Freq: Two times a day (BID) | ORAL | 1 refills | Status: DC

## 2019-05-17 NOTE — Assessment & Plan Note (Addendum)
At baseline, patient reports BMs every 3-4 days with straining at times.  Intermittent bright red blood per rectum over the last 1-2 years.  Unable to get an answer as to how often this occurs.  He does report rectal burning at times and thinks he has hemorrhoids.  When asked about prolapsing tissue he states "I guess so."  Admits to unintentional weight loss but again difficult to determine how much.  States he used to be around 148 lbs but has not been in the 140s for greater than 1 year. No family history of colon cancer. Per patient, recent blood work with PCP revealed low iron and he has since started OTC iron supplements.    Patient may have chronic idiopathic constipation.  However, with rectal bleeding, we will need to pursue colonoscopy to rule out malignancy and colon polyps as cussed above under rectal bleeding.  Start MiraLAX 1 capful (17 g) daily in 8 ounces of water.  He I was advised to let me know if this does not help produce BMs daily to every other day. Also advised to add Benefiber or Metamucil daily. Drink enough water to keep urine pale yellow to clear. Follow-up after colonoscopy.

## 2019-05-17 NOTE — Assessment & Plan Note (Addendum)
39 year old male with chronic history of GERD who has historically been on Nexium 20 mg daily which worked well but if he misses a dose, symptoms return immediately.  Currently with reflux symptoms several days a week, waking up in the middle of the night, and intermittent upper abdominal pain.  Denies nausea, vomiting, dysphagia, or melena.  1 to 2-year history of intermittent rectal bleeding.  Also with unintentional weight loss and inability to gain weight.  He was on meloxicam daily but this was discontinued by PCP on 04/29/2019.  Patient is unable to tell me how long he was taking this medication.  Reports recent lab work with PCP a few weeks ago with low iron.  Abdominal exam is benign today.  GERD is likely influenced by dietary habits.  He is going to need a daily medication.  Considering history of regular meloxicam use and intermittent upper abdominal pain, query whether he has gastritis/duodenitis.  Less likely PUD but this is also possible.  Considering iron deficiency, patient may need EGD for complete evaluation; however, due to rectal bleeding and unintentional weight loss as well as our scheduling being pushed out due to COVID-19, I feel we need to pursue colonoscopy first to exclude colon cancer and circle back to EGD as necessary.  At this time, will go ahead and treat as if he has PUD with Protonix 40 mg twice daily.  Start Protonix 40 mg twice daily.  Counseled on a GERD diet/lifestyle.  Handout provided. Advised to avoid all NSAIDs. Request recent labs from PCP. Consider start going back to EGD if he has IDA and colonoscopy for rectal bleeding is without any significant findings. Follow-up after colonoscopy.  Addendum:  Received and reviewed his recent labs completed with his PCP dated 04/29/2019.  Hemoglobin was low at 12.1 with normocytic indices.  Patient reported being told his iron was low but it doesn't appear he had and iron panel completed.  Plans to complete iron panel, B12,  and folate. Otherwise, we will proceed with TCS as planned.

## 2019-05-17 NOTE — Assessment & Plan Note (Addendum)
39 year old male reporting 1-2-year history of intermittent rectal bleeding with bright red blood in the toilet water and on toilet tissue.  Difficult to get a clear history how often this occurs.  He denies hard stools but does report straining at times with BMs every 3-4 days which is his baseline.  Thinks he may have hemorrhoids as he does have rectal burning at times and uses hemorrhoid cream which helps.  Also with unintentional weight loss and inability to gain weight over the last year.  Reports blood work with PCP a couple weeks ago with low iron and has since started OTC iron. No prior colonoscopy. No family history of colon cancer.  Patient declined rectal exam.  Differentials for rectal bleeding include hemorrhoidal bleed, colon polyps, and malignancy.   Proceed with TCS with propofol with RMR in the near future.  The risks, benefits, and alternatives have been discussed in detail with patient. They have stated understanding and desire to proceed.  Propofol due to Xanax and daily marijuana use. Hold iron x7 days prior to procedure. Add MiraLAX 1 capful (17 g) daily in 8 ounces of water. Add Benefiber or Metamucil daily. Drink enough water to keep urine pale yellow to clear. Continue to use Preparation H as needed. Avoid straining. Do not sit on the toilet for more than 2-3 minutes at a time. Follow-up after procedure.  Patient was advised to proceed to the emergency room should he have significant rectal bleeding, lightheadedness, dizziness, weakness, feeling like he would pass out.  Addendum:  Received and reviewed his recent labs completed with his PCP dated 04/29/2019.  Hemoglobin was low at 12.1 with normocytic indices.  Patient reported being told his iron was low but it doesn't appear he had and iron panel completed.  Plans to complete iron panel, B12, and folate. Otherwise, we will proceed with TCS as planned.

## 2019-05-17 NOTE — Patient Instructions (Addendum)
We will get you scheduled for colonoscopy in the near future.  You will need to hold your iron for 7 days prior to procedure.  For reflux and possible gastritis: I am sending in Protonix 40 mg twice daily.  Take this 30 minutes before breakfast and dinner for the next 8 weeks then decrease to once daily. Avoid fried, fatty, greasy, spicy, citrus foods, caffeine, carbonated beverages, and chocolate.  Do not eat within 3 hours of laying down. Prop the head of your bed up on wood or bricks to create a 6 inch incline. Avoid all NSAIDs including ibuprofen, Aleve, Advil, Goody powders.   For constipation: Start using MiraLAX 1 capful (17 g) daily at 8 ounces of water.  If this does not help you have a bowel movement daily to every other day, please let me know. You should also add Benefiber or Metamucil daily.  These are fiber supplements that will help with constipation as well. Drink enough water to keep your urine pale yellow to clear.  For possible hemorrhoids: Continue to use Preparation H as needed. Avoid straining. Do not sit on the toilet for more than 2-3 minutes at a time.  If you had significant rectal bleeding, feel lightheaded, dizzy, significantly weak, or like you may pass out, you should proceed to the emergency room. Monitor for black stools and let me know if this occurs.   We will see you back after your colonoscopy.  Call if you have questions or concerns prior.  Ermalinda Memos, PA-C [facility]          Food Choices for Gastroesophageal Reflux Disease, Adult When you have gastroesophageal reflux disease (GERD), the foods you eat and your eating habits are very important. Choosing the right foods can help ease your discomfort. Think about working with a nutrition specialist (dietitian) to help you make good choices. What are tips for following this plan?  Meals  Choose healthy foods that are low in fat, such as fruits, vegetables, whole grains,  low-fat dairy products, and lean meat, fish, and poultry.  Eat small meals often instead of 3 large meals a day. Eat your meals slowly, and in a place where you are relaxed. Avoid bending over or lying down until 2-3 hours after eating.  Avoid eating meals 2-3 hours before bed.  Avoid drinking a lot of liquid with meals.  Cook foods using methods other than frying. Bake, grill, or broil food instead.  Avoid or limit: ? Chocolate. ? Peppermint or spearmint. ? Alcohol. ? Pepper. ? Black and decaffeinated coffee. ? Black and decaffeinated tea. ? Bubbly (carbonated) soft drinks. ? Caffeinated energy drinks and soft drinks.  Limit high-fat foods such as: ? Fatty meat or fried foods. ? Whole milk, cream, butter, or ice cream. ? Nuts and nut butters. ? Pastries, donuts, and sweets made with butter or shortening.  Avoid foods that cause symptoms. These foods may be different for everyone. Common foods that cause symptoms include: ? Tomatoes. ? Oranges, lemons, and limes. ? Peppers. ? Spicy food. ? Onions and garlic. ? Vinegar. Lifestyle  Maintain a healthy weight. Ask your doctor what weight is healthy for you. If you need to lose weight, work with your doctor to do so safely.  Exercise for at least 30 minutes for 5 or more days each week, or as told by your doctor.  Wear loose-fitting clothes.  Do not smoke. If you need help quitting, ask your doctor.  Sleep with the head of your  bed higher than your feet. Use a wedge under the mattress or blocks under the bed frame to raise the head of the bed. Summary  When you have gastroesophageal reflux disease (GERD), food and lifestyle choices are very important in easing your symptoms.  Eat small meals often instead of 3 large meals a day. Eat your meals slowly, and in a place where you are relaxed.  Limit high-fat foods such as fatty meat or fried foods.  Avoid bending over or lying down until 2-3 hours after eating.  Avoid  peppermint and spearmint, caffeine, alcohol, and chocolate. This information is not intended to replace advice given to you by your health care provider. Make sure you discuss any questions you have with your health care provider. Document Revised: 05/21/2018 Document Reviewed: 03/05/2016 Elsevier Patient Education  2020 [place] for Gastroesophageal Reflux Disease, Adult When you have gastroesophageal reflux disease (GERD), the foods you eat and your eating habits are very important. Choosing the right foods can help ease your discomfort. Think about working with a nutrition specialist (dietitian) to help you make good choices. What are tips for following this plan?  Meals  Choose healthy foods that are low in fat, such as fruits, vegetables, whole grains, low-fat dairy products, and lean meat, fish, and poultry.  Eat small meals often instead of 3 large meals a day. Eat your meals slowly, and in a place where you are relaxed. Avoid bending over or lying down until 2-3 hours after eating.  Avoid eating meals 2-3 hours before bed.  Avoid drinking a lot of liquid with meals.  Cook foods using methods other than frying. Bake, grill, or broil food instead.  Avoid or limit: ? Chocolate. ? Peppermint or spearmint. ? Alcohol. ? Pepper. ? Black and decaffeinated coffee. ? Black and decaffeinated tea. ? Bubbly (carbonated) soft drinks. ? Caffeinated energy drinks and soft drinks.  Limit high-fat foods such as: ? Fatty meat or fried foods. ? Whole milk, cream, butter, or ice cream. ? Nuts and nut butters. ? Pastries, donuts, and sweets made with butter or shortening.  Avoid foods that cause symptoms. These foods may be different for everyone. Common foods that cause symptoms include: ? Tomatoes. ? Oranges, lemons, and limes. ? Peppers. ? Spicy food. ? Onions and garlic. ? Vinegar. Lifestyle  Maintain a healthy weight. Ask your doctor what weight is healthy for  you. If you need to lose weight, work with your doctor to do so safely.  Exercise for at least 30 minutes for 5 or more days each week, or as told by your doctor.  Wear loose-fitting clothes.  Do not smoke. If you need help quitting, ask your doctor.  Sleep with the head of your bed higher than your feet. Use a wedge under the mattress or blocks under the bed frame to raise the head of the bed. Summary  When you have gastroesophageal reflux disease (GERD), food and lifestyle choices are very important in easing your symptoms.  Eat small meals often instead of 3 large meals a day. Eat your meals slowly, and in a place where you are relaxed.  Limit high-fat foods such as fatty meat or fried foods.  Avoid bending over or lying down until 2-3 hours after eating.  Avoid peppermint and spearmint, caffeine, alcohol, and chocolate. This information is not intended to replace advice given to you by your health care provider. Make sure you discuss any questions you have with your health care  provider. Document Revised: 05/21/2018 Document Reviewed: 03/05/2016 Elsevier Patient Education  2020 [employer].

## 2019-05-18 ENCOUNTER — Telehealth: Payer: Self-pay | Admitting: Gastroenterology

## 2019-05-18 ENCOUNTER — Telehealth: Payer: Self-pay | Admitting: *Deleted

## 2019-05-18 NOTE — Telephone Encounter (Signed)
Patient on cancellation list for sooner TCS per [facility]  Called patient and his procedure has been moved up to 06/07/2019 at 9:15am. Patient aware will fax instructions to pharmacy. He has not gotten prep yet. He is aware he will need to start holding iron 05/31/19.  I called [facility] and was advised they had to order his prep and received it today. Aware will fax prep instructions to go with his prep. Confirmation received fax went through. I have also mailed instructions with pre-op/covid testing appt.  Called patient back and is aware of new pre-op/covid testing appt details.

## 2019-05-18 NOTE — Telephone Encounter (Signed)
Received and reviewed his recent labs completed with PCP dated 04/29/2019.  CBC: WBC 9.9, hemoglobin 12.1 (L), hematocrit 36.8 (L), MCV 91, MCH 29.9, MCHC 32.9, platelets 322 CMP: Glucose 83, sodium 137, potassium 4.1, chloride 97, creatinine 1.1, calcium 9.1, albumin 3.8, alk phos 60, total bilirubin 0.5, AST 31, ALT 16.  Tim Hahn, please let patient know I have received and reviewed his recent labs completed with his PCP dated 04/29/2019.  Hemoglobin was low at 12.1.  Patient reported being told his iron was low but it doesn't appear he had and iron panel completed.    At this point, I would recommend he have an iron panel with ferritin completed so we have this on file. In addition, I would also like to check B12 and folate. Please arrange.   Otherwise, we will proceed with TCS as planned.

## 2019-05-19 ENCOUNTER — Other Ambulatory Visit: Payer: Self-pay

## 2019-05-19 DIAGNOSIS — D649 Anemia, unspecified: Secondary | ICD-10-CM

## 2019-05-19 DIAGNOSIS — E611 Iron deficiency: Secondary | ICD-10-CM

## 2019-05-19 NOTE — Progress Notes (Signed)
io

## 2019-05-19 NOTE — Telephone Encounter (Signed)
Noted. Spoke with pt. Pt was notified that his labs were reviewed and results were given. Lab orders were mailed to pt and pt will have labs done.

## 2019-06-02 NOTE — Patient Instructions (Signed)
Tim Hahn  06/02/2019     @PREFPERIOPPHARMACY @   Your procedure is scheduled on  06/07/2019 .  Report to 06/09/2019 at  0745  A.M.  Call this number if you have problems the morning of surgery:  [phone]   Remember:  Follow the diet and prep instructions given to you by Dr [phone] office.                       Take these medicines the morning of surgery with A SIP OF WATER  Xanax(if needed), protonix.    Do not wear jewelry, make-up or nail polish.  Do not wear lotions, powders, or perfumes. Please wear deodorant and brush your teeth.  Do not shave 48 hours prior to surgery.  Men may shave face and neck.  Do not bring valuables to the hospital.  [facility] is not responsible for any belongings or valuables.  Contacts, dentures or bridgework may not be worn into surgery.  Leave your suitcase in the car.  After surgery it may be brought to your room.  For patients admitted to the hospital, discharge time will be determined by your treatment team.  Patients discharged the day of surgery will not be allowed to drive home.   Name and phone number of your driver:   family Special instructions:  DO NOT smoke the morning of your procedure.  Please read over the following fact sheets that you were given. Anesthesia Post-op Instructions and Care and Recovery After Surgery       Colonoscopy, Adult, Care After This sheet gives you information about how to care for yourself after your procedure. Your health care provider may also give you more specific instructions. If you have problems or questions, contact your health care provider. What can I expect after the procedure? After the procedure, it is common to have:  A small amount of blood in your stool for 24 hours after the procedure.  Some gas.  Mild cramping or bloating of your abdomen. Follow these instructions at home: Eating and drinking   Drink enough fluid to keep your urine pale yellow.  Follow  instructions from your health care provider about eating or drinking restrictions.  Resume your normal diet as instructed by your health care provider. Avoid heavy or fried foods that are hard to digest. Activity  Rest as told by your health care provider.  Avoid sitting for a long time without moving. Get up to take short walks every 1-2 hours. This is important to improve blood flow and breathing. Ask for help if you feel weak or unsteady.  Return to your normal activities as told by your health care provider. Ask your health care provider what activities are safe for you. Managing cramping and bloating   Try walking around when you have cramps or feel bloated.  Apply heat to your abdomen as told by your health care provider. Use the heat source that your health care provider recommends, such as a moist heat pack or a heating pad. ? Place a towel between your skin and the heat source. ? Leave the heat on for 20-30 minutes. ? Remove the heat if your skin turns bright red. This is especially important if you are unable to feel pain, heat, or cold. You may have a greater risk of getting burned. General instructions  For the first 24 hours after the procedure: ? Do not drive or use machinery. ?  Do not sign important documents. ? Do not drink alcohol. ? Do your regular daily activities at a slower pace than normal. ? Eat soft foods that are easy to digest.  Take over-the-counter and prescription medicines only as told by your health care provider.  Keep all follow-up visits as told by your health care provider. This is important. Contact a health care provider if:  You have blood in your stool 2-3 days after the procedure. Get help right away if you have:  More than a small spotting of blood in your stool.  Large blood clots in your stool.  Swelling of your abdomen.  Nausea or vomiting.  A fever.  Increasing pain in your abdomen that is not relieved with  medicine. Summary  After the procedure, it is common to have a small amount of blood in your stool. You may also have mild cramping and bloating of your abdomen.  For the first 24 hours after the procedure, do not drive or use machinery, sign important documents, or drink alcohol.  Get help right away if you have a lot of blood in your stool, nausea or vomiting, a fever, or increased pain in your abdomen. This information is not intended to replace advice given to you by your health care provider. Make sure you discuss any questions you have with your health care provider. Document Revised: 08/24/2018 Document Reviewed: 08/24/2018 Elsevier Patient Education  [place] After These instructions provide you with information about caring for yourself after your procedure. Your health care provider may also give you more specific instructions. Your treatment has been planned according to current medical practices, but problems sometimes occur. Call your health care provider if you have any problems or questions after your procedure. What can I expect after the procedure? After your procedure, you may:  Feel sleepy for several hours.  Feel clumsy and have poor balance for several hours.  Feel forgetful about what happened after the procedure.  Have poor judgment for several hours.  Feel nauseous or vomit.  Have a sore throat if you had a breathing tube during the procedure. Follow these instructions at home: For at least 24 hours after the procedure:      Have a responsible adult stay with you. It is important to have someone help care for you until you are awake and alert.  Rest as needed.  Do not: ? Participate in activities in which you could fall or become injured. ? Drive. ? Use heavy machinery. ? Drink alcohol. ? Take sleeping pills or medicines that cause drowsiness. ? Make important decisions or sign legal documents. ? Take care  of children on your own. Eating and drinking  Follow the diet that is recommended by your health care provider.  If you vomit, drink water, juice, or soup when you can drink without vomiting.  Make sure you have little or no nausea before eating solid foods. General instructions  Take over-the-counter and prescription medicines only as told by your health care provider.  If you have sleep apnea, surgery and certain medicines can increase your risk for breathing problems. Follow instructions from your health care provider about wearing your sleep device: ? Anytime you are sleeping, including during daytime naps. ? While taking prescription pain medicines, sleeping medicines, or medicines that make you drowsy.  If you smoke, do not smoke without supervision.  Keep all follow-up visits as told by your health care provider. This is important. Contact a health  care provider if:  You keep feeling nauseous or you keep vomiting.  You feel light-headed.  You develop a rash.  You have a fever. Get help right away if:  You have trouble breathing. Summary  For several hours after your procedure, you may feel sleepy and have poor judgment.  Have a responsible adult stay with you for at least 24 hours or until you are awake and alert. This information is not intended to replace advice given to you by your health care provider. Make sure you discuss any questions you have with your health care provider. Document Revised: 04/28/2017 Document Reviewed: 05/21/2015 Elsevier Patient Education  [place].

## 2019-06-03 ENCOUNTER — Encounter (HOSPITAL_COMMUNITY)
Admission: RE | Admit: 2019-06-03 | Discharge: 2019-06-03 | Disposition: A | Discharge status: Home / Self Care | Payer: Medicaid Other | Source: Ambulatory Visit | Attending: Internal Medicine | Admitting: Internal Medicine

## 2019-06-03 ENCOUNTER — Other Ambulatory Visit: Payer: Self-pay

## 2019-06-03 ENCOUNTER — Encounter (HOSPITAL_COMMUNITY): Payer: Self-pay

## 2019-06-03 ENCOUNTER — Other Ambulatory Visit (HOSPITAL_COMMUNITY)
Admission: RE | Admit: 2019-06-03 | Discharge: 2019-06-03 | Disposition: A | Discharge status: Home / Self Care | Payer: Medicaid Other | Source: Ambulatory Visit | Attending: Internal Medicine | Admitting: Internal Medicine

## 2019-06-04 ENCOUNTER — Telehealth: Payer: Self-pay | Admitting: Internal Medicine

## 2019-06-04 NOTE — Telephone Encounter (Signed)
ESTMelanie from Short Stay called to let us know patient was a no show for his covid test and is on schedule with RMR for Monday.

## 2019-06-04 NOTE — Telephone Encounter (Signed)
Elsie Amis, RN  [place] Jon, Ewell Poe, CMA  Hey Darius Fillingim! I just saw your note on Tim Hahn. He did not show for his PAT yesterday at 1530 either. We called his Mom and she said he "wasn't having it done". I am sorry I am just sending you this, it was late yesterday when we found out and its been CRAZY here this morning ----   Lorain Childes to [facility] and Dr. Jena Gauss

## 2019-06-04 NOTE — Telephone Encounter (Signed)
Called spoke with patient mom Liborio Nixon. She stated patient was not available right now. I advised patient needs to call us back prior to 12:00 today as he is scheduled for procedure Monday. She states he gets in his moods and don't do what he is suppose to do but she will tell him to call us.

## 2019-06-04 NOTE — Telephone Encounter (Signed)
Communication noted.  

## 2019-06-04 NOTE — Telephone Encounter (Signed)
noted 

## 2019-06-07 ENCOUNTER — Ambulatory Visit (HOSPITAL_COMMUNITY): Admission: RE | Admit: 2019-06-07 | Payer: Medicaid Other | Source: Home / Self Care | Admitting: Internal Medicine

## 2019-06-07 ENCOUNTER — Encounter (HOSPITAL_COMMUNITY): Admission: RE | Payer: Self-pay | Source: Home / Self Care

## 2019-06-07 SURGERY — COLONOSCOPY WITH PROPOFOL
Anesthesia: Monitor Anesthesia Care

## 2019-08-20 ENCOUNTER — Other Ambulatory Visit (HOSPITAL_COMMUNITY): Payer: Medicaid Other

## 2019-12-04 ENCOUNTER — Other Ambulatory Visit: Payer: Self-pay | Admitting: Gastroenterology

## 2019-12-04 DIAGNOSIS — K219 Gastro-esophageal reflux disease without esophagitis: Secondary | ICD-10-CM

## 2020-03-03 ENCOUNTER — Ambulatory Visit: Payer: Self-pay

## 2020-03-03 NOTE — Telephone Encounter (Signed)
Patient called community line with [place] pain rt arm. He states it started last night He has not injured the arrn He denies  Rash. No swelling. He has been using a lotion for pain on it that helps.  Patient was just asking advice. I informed him that he should have it check at nearest [facility] or ER. He then ended conversation with Ok. No care advice was read to patient.  Reason for Disposition . [1] SEVERE pain AND [2] not improved 2 hours after pain medicine  Answer Assessment - Initial Assessment Questions 1. ONSET: "When did the pain start?"     Last night 2. LOCATION: "Where is the pain located?"    Rt arm 3. PAIN: "How bad is the pain?" (Scale 1-10; or mild, moderate, severe)   - MILD (1-3): doesn't interfere with normal activities   - MODERATE (4-7): interferes with normal activities (e.g., work or school) or awakens from sleep   - SEVERE (8-10): excruciating pain, unable to do any normal activities, unable to hold a cup of water    7-8 4. WORK OR EXERCISE: "Has there been any recent work or exercise that involved this part of the body?"    unknow 5. CAUSE: "What do you think is causing the arm pain?"     unsure 6. OTHER SYMPTOMS: "Do you have any other symptoms?" (e.g., neck pain, swelling, rash, fever, numbness, weakness)     pain 7. PREGNANCY: "Is there any chance you are pregnant?" "When was your last menstrual period?"     N/A  Protocols used: ARM PAIN-A-AH

## 2020-08-12 ENCOUNTER — Other Ambulatory Visit: Payer: Self-pay

## 2020-08-12 ENCOUNTER — Encounter (HOSPITAL_COMMUNITY): Payer: Self-pay | Admitting: *Deleted

## 2020-08-12 ENCOUNTER — Emergency Department (HOSPITAL_COMMUNITY)
Admission: EM | Admit: 2020-08-12 | Discharge: 2020-08-12 | Disposition: A | Discharge status: Home / Self Care | Payer: Medicaid Other | Attending: Emergency Medicine | Admitting: Emergency Medicine

## 2020-08-12 DIAGNOSIS — Z139 Encounter for screening, unspecified: Secondary | ICD-10-CM | POA: Insufficient documentation

## 2020-08-12 NOTE — Discharge Instructions (Addendum)
Call your primary care doctor or specialist as discussed in the next 2-3 days.   Return immediately back to the ER if:  Your symptoms worsen within the next 12-24 hours. You develop new symptoms such as new fevers, persistent vomiting, new pain, shortness of breath, or new weakness or numbness, or if you have any other concerns.  

## 2020-08-12 NOTE — ED Provider Notes (Signed)
[facility] EMERGENCY DEPARTMENT Provider Note   CSN: [id number] Arrival date & time: 08/12/20  1422     History Chief Complaint  Patient presents with   Follow-up    Tim Hahn is a 40 y.o. male.  Patient presents chief complaint of exposure to mold.  He states that somebody had noted mold in his trailer a few months ago and had mentioned that maybe he should get tested for mold.  He states that his been having intermittent rashes across his body from time to time currently does not have any rash.  He spoke to his doctor about this and was told it might be hives.  He is concerned it may be due to mold and presents to ER.  Otherwise denies any fevers or cough or vomiting or diarrhea.  Denies headache chest pain or abdominal pain.  Currently there is no rash as he states it comes and goes.      Past Medical History:  Diagnosis Date   Anxiety    Depression    GERD (gastroesophageal reflux disease)    Insomnia    Scoliosis of thoracic spine     Patient Active Problem List   Diagnosis Date Noted   Constipation 05/17/2019   GERD (gastroesophageal reflux disease) 05/17/2019   Rectal bleeding 05/17/2019    History reviewed. No pertinent surgical history.     Family History  Problem Relation Age of Onset   Colon cancer Neg Hx     Social History   Tobacco Use   Smoking status: Every Day    Pack years: 0.00    Types: Cigarettes   Smokeless tobacco: Never  Substance Use Topics   Alcohol use: Yes    Comment: occasionally   Drug use: Yes    Types: Marijuana    Comment: daily    Home Medications Prior to Admission medications   Medication Sig Start Date End Date Taking? Authorizing Provider  ALPRAZolam Prudy Feeler) 0.5 MG tablet Take 0.5 mg by mouth daily. 05/01/19   [provider]  ferrous sulfate 325 (65 FE) MG tablet Take 325 mg by mouth daily with breakfast.    [provider]  minocycline (DYNACIN) 100 MG tablet Take 100 mg by mouth daily.     [provider]  pantoprazole (PROTONIX) 40 MG tablet Take 1 tablet (40 mg total) by mouth daily. 12/07/19   Tiffany Kocher, PA-C    Allergies    Patient has no known allergies.  Review of Systems   Review of Systems  Constitutional:  Negative for fever.  HENT:  Negative for ear pain and sore throat.   Eyes:  Negative for pain.  Respiratory:  Negative for cough.   Cardiovascular:  Negative for chest pain.  Gastrointestinal:  Negative for abdominal pain.  Genitourinary:  Negative for flank pain.  Musculoskeletal:  Negative for back pain.  Skin:  Positive for rash. Negative for color change.  Neurological:  Negative for syncope.  All other systems reviewed and are negative.  Physical Exam Updated Vital Signs BP (!) 149/106   Pulse 72   Temp 97.8 F (36.6 C) (Oral)   Resp 17   SpO2 100%   Physical Exam Constitutional:      General: He is not in acute distress.    Appearance: He is well-developed.  HENT:     Head: Normocephalic.     Nose: Nose normal.  Eyes:     Extraocular Movements: Extraocular movements intact.  Cardiovascular:  Rate and Rhythm: Normal rate.  Pulmonary:     Effort: Pulmonary effort is normal.  Skin:    Coloration: Skin is not jaundiced.  Neurological:     Mental Status: He is alert. Mental status is at baseline.    ED Results / Procedures / Treatments   Labs (all labs ordered are listed, but only abnormal results are displayed) Labs Reviewed - No data to display  EKG None  Radiology No results found.  Procedures Procedures   Medications Ordered in ED Medications - No data to display  ED Course  I have reviewed the triage vital signs and the nursing notes.  Pertinent labs & imaging results that were available during my care of the patient were reviewed by me and considered in my medical decision making (see chart for details).    MDM Rules/Calculators/A&P                          Patient has no other complaints of  pain or discomfort at this time.  No evidence of rash noted at this time.  Advised him that the primary treatment for mold would be to eliminate the source of the mold.  Advised follow-up with his doctor again within the week, advised immediate return if he has fevers cough difficulty breathing or any additional concerns.  Final Clinical Impression(s) / ED Diagnoses Final diagnoses:  Encounter for medical screening examination    Rx / DC Orders ED Discharge Orders     None        Cheryll Cockayne, MD 08/12/20 1723

## 2020-08-12 NOTE — ED Triage Notes (Signed)
States someone was working on their trailer a couple of months ago and advised they may need to get tested for mold, decided to come in today

## 2021-02-02 ENCOUNTER — Other Ambulatory Visit: Payer: Self-pay | Admitting: Gastroenterology

## 2021-02-02 DIAGNOSIS — K219 Gastro-esophageal reflux disease without esophagitis: Secondary | ICD-10-CM

## 2022-02-08 ENCOUNTER — Other Ambulatory Visit: Payer: Self-pay | Admitting: Gastroenterology

## 2022-02-08 DIAGNOSIS — K219 Gastro-esophageal reflux disease without esophagitis: Secondary | ICD-10-CM

## 2022-03-26 ENCOUNTER — Other Ambulatory Visit: Payer: Self-pay | Admitting: Gastroenterology

## 2022-03-26 DIAGNOSIS — K219 Gastro-esophageal reflux disease without esophagitis: Secondary | ICD-10-CM

## 2022-09-12 ENCOUNTER — Encounter (HOSPITAL_COMMUNITY): Payer: Self-pay | Admitting: Emergency Medicine

## 2022-09-12 ENCOUNTER — Other Ambulatory Visit: Payer: Self-pay

## 2022-09-12 ENCOUNTER — Emergency Department (HOSPITAL_COMMUNITY): Payer: Medicaid Other

## 2022-09-12 ENCOUNTER — Emergency Department (HOSPITAL_COMMUNITY)
Admission: EM | Admit: 2022-09-12 | Discharge: 2022-09-12 | Disposition: A | Discharge status: Home / Self Care | Payer: Medicaid Other | Attending: Emergency Medicine | Admitting: Emergency Medicine

## 2022-09-12 DIAGNOSIS — R0602 Shortness of breath: Secondary | ICD-10-CM | POA: Diagnosis not present

## 2022-09-12 DIAGNOSIS — F419 Anxiety disorder, unspecified: Secondary | ICD-10-CM | POA: Insufficient documentation

## 2022-09-12 DIAGNOSIS — R071 Chest pain on breathing: Secondary | ICD-10-CM | POA: Diagnosis not present

## 2022-09-12 DIAGNOSIS — R0781 Pleurodynia: Secondary | ICD-10-CM | POA: Diagnosis present

## 2022-09-12 LAB — BASIC METABOLIC PANEL
Anion gap: 8 (ref 5–15)
BUN: 11 mg/dL (ref 6–20)
CO2: 26 mmol/L (ref 22–32)
Calcium: 9.2 mg/dL (ref 8.9–10.3)
Chloride: 101 mmol/L (ref 98–111)
Creatinine, Ser: 1.1 mg/dL (ref 0.61–1.24)
GFR, Estimated: >60 mL/min (ref >60)
Glucose, Bld: 94 mg/dL (ref 70–99)
Potassium: 4 mmol/L (ref 3.5–5.1)
Sodium: 135 mmol/L (ref 135–145)

## 2022-09-12 LAB — CBC
HCT: 40.4 % (ref 39.0–52.0)
Hemoglobin: 13.4 g/dL (ref 13.0–17.0)
MCH: 29.7 pg (ref 26.0–34.0)
MCHC: 33.2 g/dL (ref 30.0–36.0)
MCV: 89.6 fL (ref 80.0–100.0)
Platelets: 350 10*3/uL (ref 150–400)
RBC: 4.51 MIL/uL (ref 4.22–5.81)
RDW: 13.6 % (ref 11.5–15.5)
WBC: 10.3 10*3/uL (ref 4.0–10.5)
nRBC: 0 % (ref 0.0–0.2)

## 2022-09-12 LAB — TROPONIN I (HIGH SENSITIVITY): Troponin I (High Sensitivity): 3 ng/L (ref <18)

## 2022-09-12 NOTE — ED Provider Triage Note (Signed)
Emergency Medicine Provider Triage Evaluation Note  ZAEDIN BASHA , a 42 y.o. male  was evaluated in triage.  Pt complains of left-sided chest pain that began at 1 PM today.  Patient dates he noticed the chest pain after he got up and took 3 aspirin which resolved the chest pain but states he still feels a slight pressure.  Patient denies any shortness of breath, nausea/vomiting.  Patient states he is not sure what makes the pain worse but states that the aspirin did help.  Chest pain does not radiate and patient denies any recent travel, hospitalizations, surgeries or past history of blood clots.  Patient denies any weakness or changes in sensation/motor skills or fevers.  Review of Systems  Positive: See HPI Negative: See HPI  Physical Exam  Ht 5\' 9"  (1.753 m)   Wt 63.5 kg   BMI 20.67 kg/m  Gen:   Awake, no distress   Resp:  Normal effort  MSK:   Moves extremities without difficulty  Other:    Medical Decision Making  Medically screening exam initiated at 2:35 PM.  Appropriate orders placed.  WILLMAN BRIGGS was informed that the remainder of the evaluation will be completed by another provider, this initial triage assessment does not replace that evaluation, and the importance of remaining in the ED until their evaluation is complete.  Workup initiated, patient stable at this time   Remi Deter 09/12/22 1436

## 2022-09-12 NOTE — ED Provider Notes (Signed)
[place] EMERGENCY DEPARTMENT AT [facility] Provider Note   CSN: [id number] Arrival date & time: 09/12/22  1346     History  Chief Complaint  Patient presents with   Chest Pain    Tim Hahn is a 42 y.o. male.  42 year old male with a history of anxiety on Xanax who presents emergency department with chest pain.  At noon today started experiencing left-sided sharp chest pain.  Says it was pleuritic.  Did have some associated shortness of breath.  No cough, leg swelling, diaphoresis, vomiting, recent surgeries, history of DVT or PE, or hormone use.  Says that it resolved after taking some aspirin.  Says he was somewhat anxious and thinks that it may have been due to a panic attack.  No personal history of MI.       Home Medications Prior to Admission medications   Medication Sig Start Date End Date Taking? Authorizing Provider  ALPRAZolam Prudy Feeler) 0.5 MG tablet Take 0.5 mg by mouth daily. 05/01/19   [provider]  ferrous sulfate 325 (65 FE) MG tablet Take 325 mg by mouth daily with breakfast.    [provider]  minocycline (DYNACIN) 100 MG tablet Take 100 mg by mouth daily.    [provider]  pantoprazole (PROTONIX) 40 MG tablet TAKE (1) TABLET A DAY. 02/06/21   Gelene Mink, NP      Allergies    Patient has no known allergies.    Review of Systems   Review of Systems  Physical Exam Updated Vital Signs BP (!) 138/104   Pulse 75   Temp 97.9 F (36.6 C) (Oral)   Resp 16   Ht 5\' 9"  (1.753 m)   Wt 63.5 kg   SpO2 99%   BMI 20.67 kg/m  Physical Exam Vitals and nursing note reviewed.  Constitutional:      General: He is not in acute distress.    Appearance: He is well-developed.  HENT:     Head: Normocephalic and atraumatic.     Right Ear: External ear normal.     Left Ear: External ear normal.     Nose: Nose normal.  Eyes:     Extraocular Movements: Extraocular movements intact.     Conjunctiva/sclera: Conjunctivae  normal.     Pupils: Pupils are equal, round, and reactive to light.  Cardiovascular:     Rate and Rhythm: Normal rate and regular rhythm.     Heart sounds: Normal heart sounds.     Comments: Chest pain not reproducible Pulmonary:     Effort: Pulmonary effort is normal. No respiratory distress.     Breath sounds: Normal breath sounds.  Abdominal:     General: There is no distension.     Palpations: Abdomen is soft. There is no mass.     Tenderness: There is no abdominal tenderness. There is no guarding.  Musculoskeletal:     Cervical back: Normal range of motion and neck supple.     Right lower leg: No edema.     Left lower leg: No edema.  Skin:    General: Skin is warm and dry.  Neurological:     Mental Status: He is alert. Mental status is at baseline.  Psychiatric:        Mood and Affect: Mood normal.        Behavior: Behavior normal.     ED Results / Procedures / Treatments   Labs (all labs ordered are listed, but only abnormal  results are displayed) Labs Reviewed  BASIC METABOLIC PANEL  CBC  TROPONIN I (HIGH SENSITIVITY)    EKG EKG Interpretation Date/Time:  Thursday September 12 2022 14:16:27 EDT Ventricular Rate:  72 PR Interval:  138 QRS Duration:  88 QT Interval:  388 QTC Calculation: 424 R Axis:   84  Text Interpretation: Normal sinus rhythm with sinus arrhythmia Normal ECG No previous ECGs available Confirmed by Vonita Moss [phone]) on 09/12/2022 4:47:20 PM  Radiology DG Chest 2 View  Result Date: 09/12/2022 CLINICAL DATA:  Pain EXAM: CHEST - 2 VIEW COMPARISON:  05/24/2017 x-ray FINDINGS: No consolidation, pneumothorax or effusion. No edema. Normal cardiopericardial silhouette. Hyperinflation. IMPRESSION: No acute cardiopulmonary disease. Electronically Signed   By: Karen Kays M.D.   On: 09/12/2022 14:56    Procedures Procedures    Medications Ordered in ED Medications - No data to display  ED Course/ Medical Decision Making/ A&P                                  Medical Decision Making Amount and/or Complexity of Data Reviewed Labs: ordered. Radiology: ordered.   Tim Hahn is a 42 y.o. male with comorbidities that complicate the patient evaluation including anxiety on Xanax who presents to the emergency department chest pain  Initial Ddx:  PE, costochondritis, pericarditis, reflux, MI  MDM/Course:  Patient presents to the emergency department with sharp pleuritic chest pain and some mild shortness of breath.  Initially was concerned about possible pulmonary embolism but he has a Geneva score of 3 which is low risk for PE and is PE RC negative so has been clinically ruled out.  Chest x-ray did not reveal any acute abnormalities.  Not reproducible on exam which I would suspect from costochondritis.  Upon re-evaluation her chest pain had totally resolved and has not recurred.  Did have an EKG and troponin that were unremarkable and does not appear to have characteristics that would be consistent with an MI such as an exertional component or diaphoresis or radiation or vomiting.  Patient was instructed to take Tylenol and ibuprofen for his pain and to follow-up with his primary doctor in several days regarding his symptoms.  This patient presents to the ED for concern of complaints listed in HPI, this involves an extensive number of treatment options, and is a complaint that carries with it a high risk of complications and morbidity. Disposition including potential need for admission considered.   Dispo: DC Home. Return precautions discussed including, but not limited to, those listed in the AVS. Allowed pt time to ask questions which were answered fully prior to dc.  Records reviewed Outpatient Clinic Notes The following labs were independently interpreted: Chemistry and show no acute abnormality I independently reviewed the following imaging with scope of interpretation limited to determining acute life threatening conditions  related to emergency care: Chest x-ray and agree with the radiologist interpretation with the following exceptions: none I personally reviewed and interpreted the pt's EKG: see above for interpretation  I have reviewed the patients home medications and made adjustments as needed       Final Clinical Impression(s) / ED Diagnoses Final diagnoses:  Chest pain on breathing    Rx / DC Orders ED Discharge Orders     None         Rondel Baton, MD 09/13/22 1128

## 2022-09-12 NOTE — ED Triage Notes (Signed)
Pt reports left sided sharp chest pain intermittently since this am, reports mild SOB, denies n/v; reports taking 3 baby aspirin PTA

## 2022-09-12 NOTE — Discharge Instructions (Signed)
You were seen for your chest pain in the emergency department.   At home, please take Tylenol and ibuprofen for any additional pain that you have.    Check your MyChart online for the results of any tests that had not resulted by the time you left the emergency department.   Follow-up with your primary doctor in 2-3 days regarding your visit.    Return immediately to the emergency department if you experience any of the following: Worsening pain, difficulty breathing, or any other concerning symptoms.    Thank you for visiting our Emergency Department. It was a pleasure taking care of you today.

## 2023-03-04 NOTE — Progress Notes (Unsigned)
Referring Provider: Rebekah Chesterfield, NP Primary Care Physician:  Rebekah Chesterfield, NP Primary Gastroenterologist:  Dr. Jena Gauss  Chief Complaint  Patient presents with   Gastroesophageal Reflux    Heart burn, Chest is hurting all the time.     HPI:   Tim Hahn is a 43 y.o. male presenting today at the request of Rebekah Chesterfield, NP for GERD.   Patient previously seen in April 2021 for rectal bleeding, constipation, GERD, anemia.  Recommended Protonix 40 mg twice daily, colonoscopy, MiraLAX daily, and may need to circle back to upper endoscopy if evidence of iron deficiency on labs. Labs showed Hgb of 12.1 with normocytic indices, no iron panel completed.  Ultimately, patient no showed for his colonoscopy and was lost to follow-up.  Today:  Just started pantoprazole yesterday per PCP. States he was taking some white pill prior to this. Not sure what it was called.   Has been having chest pain and PCP said it could be related to heartburn but denies having any typical heartburn symptoms.  Chest pain does not occur after meals.  He does have some chest pain at times when laying down.  However, he also reports exertional chest pain stating he no longer plays basketball due to this.  States this is been going on for about 1 year.  He was hoping to be referred to a cardiologist, but PCP sent him back to our office.  Also with occasional shortness of breath.   Denies abdominal pain, dysphagia, nausea, or vomiting.   No constipation, diarrhea. Still has intermittent rectal bleeding.  Occurs less than once a month.  Blood is bright red, in the water. No rectal pain.  States he probably has hemorrhoids.  Bleeding will only occur x 1 then resolved.   Denies NSAIDs.  Reviewed most recent labs available in August 2024.  Hemoglobin within normal limits at 13.4 with normocytic indices.   Past Medical History:  Diagnosis Date   Anxiety    Depression    GERD (gastroesophageal reflux  disease)    Insomnia    Scoliosis of thoracic spine     History reviewed. No pertinent surgical history.  Current Outpatient Medications  Medication Sig Dispense Refill   ALPRAZolam (XANAX) 0.5 MG tablet Take 0.5 mg by mouth daily.     pantoprazole (PROTONIX) 40 MG tablet TAKE (1) TABLET A DAY. 60 tablet 5   VRAYLAR 3 MG capsule Take 3 mg by mouth daily.     ferrous sulfate 325 (65 FE) MG tablet Take 325 mg by mouth daily with breakfast. (Patient not taking: Reported on 03/06/2023)     minocycline (DYNACIN) 100 MG tablet Take 100 mg by mouth daily. (Patient not taking: Reported on 03/06/2023)     No current facility-administered medications for this visit.    Allergies as of 03/06/2023   (No Known Allergies)    Family History  Problem Relation Age of Onset   Colon cancer Neg Hx     Social History   Socioeconomic History   Marital status: Single    Spouse name: Not on file   Number of children: Not on file   Years of education: Not on file   Highest education level: Not on file  Occupational History   Not on file  Tobacco Use   Smoking status: Every Day    Types: Cigarettes   Smokeless tobacco: Never  Vaping Use   Vaping status: Never Used  Substance and Sexual Activity  Alcohol use: Yes    Comment: every other day   Drug use: Yes    Types: Marijuana, Cocaine    Comment: daily   Sexual activity: Not on file  Other Topics Concern   Not on file  Social History Narrative   Not on file   Social Drivers of Health   Financial Resource Strain: Not on file  Food Insecurity: Not on file  Transportation Needs: Not on file  Physical Activity: Not on file  Stress: Not on file  Social Connections: Not on file  Intimate Partner Violence: Not on file    Review of Systems: Gen: Denies any fever, chills, cold or flulike symptoms, presyncope, syncope. CV: Denies chest pain, heart palpitations.  Resp: Denies shortness of breath, cough. GI: See HPI GU : Denies urinary  burning, urinary frequency, urinary hesitancy MS: Denies joint pain.  Heme: See HPI  Physical Exam: BP 123/83 (BP Location: Right Arm, Patient Position: Sitting, Cuff Size: Large)   Pulse 86   Temp 97.7 F (36.5 C) (Temporal)   Ht 5\' 9"  (1.753 m)   Wt 142 lb 6.4 oz (64.6 kg)   BMI 21.03 kg/m  General:   Alert and oriented. Pleasant and cooperative. Well-nourished and well-developed.  Head:  Normocephalic and atraumatic. Eyes:  Without icterus, sclera clear and conjunctiva pink.  Ears:  Normal auditory acuity. Lungs:  Clear to auscultation bilaterally. No wheezes, rales, or rhonchi. No distress.  Heart:  S1, S2 present without murmurs appreciated.  Abdomen:  +BS, soft, non-tender and non-distended. No HSM noted. No guarding or rebound. No masses appreciated.  Rectal:  Deferred  Msk:  Symmetrical without gross deformities. Normal posture. Extremities:  Without edema. Neurologic:  Alert and  oriented x4;  grossly normal neurologically. Skin:  Intact without significant lesions or rashes. Psych:  Normal mood and affect.    Assessment:  43 year old male with history of anxiety, depression, insomnia, GERD, chronic intermittent rectal bleeding, presenting today at the request of Rebekah Chesterfield, NP for GERD; however, patient's chief complaint was chest pain.  Chest pain/GERD: May be multifactorial.  In setting of exertional chest pain, concern for cardiac etiology.  Also reporting chest pain with laying down at times which could be related to nocturnal GERD.  PCP to started pantoprazole yesterday per patient's report, so we will continue this for now. Advised if nocturnal symptoms continue, we could increase pantoprazole to 40 mg BID. Also placing referral to cardiology.  Rectal bleeding:  Chronic, intermittent, infrequent. No associated rectal pain. Last Hgb wnl in August. Previously recommended coloscopy in 2021, but no showed for colonoscopy. We will get this scheduled in the near  future pending cardiac evaluation for chest pain as discussed above.     Plan:  Refer to cardiology. Continue pantoprazole 40 mg daily. Follow a GERD diet:  Avoid fried, fatty, greasy, spicy, citrus foods. Avoid caffeine and carbonated beverages. Avoid chocolate. Try eating 4-6 small meals a day rather than 3 large meals. Do not eat within 3 hours of laying down. Prop head of bed up on wood or bricks to create a 6 inch incline. Patient will me know if he continues with nocturnal chest discomfort and we can increase pantoprazole to twice daily to see if this helps. Needs colonoscopy in the near future once cleared by cardiology. Follow-up in 3 months or sooner if needed.   Ermalinda Memos, PA-C [facility] 03/06/2023

## 2023-03-06 ENCOUNTER — Encounter: Payer: Self-pay | Admitting: Gastroenterology

## 2023-03-06 ENCOUNTER — Ambulatory Visit: Payer: Medicaid Other | Admitting: Gastroenterology

## 2023-03-06 ENCOUNTER — Other Ambulatory Visit: Payer: Self-pay | Admitting: *Deleted

## 2023-03-06 VITALS — BP 123/83 | HR 86 | Temp 97.7°F | Ht 69.0 in | Wt 142.4 lb

## 2023-03-06 DIAGNOSIS — R079 Chest pain, unspecified: Secondary | ICD-10-CM

## 2023-03-06 DIAGNOSIS — K625 Hemorrhage of anus and rectum: Secondary | ICD-10-CM | POA: Diagnosis not present

## 2023-03-06 DIAGNOSIS — K219 Gastro-esophageal reflux disease without esophagitis: Secondary | ICD-10-CM | POA: Diagnosis not present

## 2023-03-06 NOTE — Patient Instructions (Addendum)
We are placing a referral to cardiology for you.   Continue taking Pantoprazole 40 mg daily as this was just started yesterday.   Follow a GERD diet:  Avoid fried, fatty, greasy, spicy, citrus foods. Avoid caffeine and carbonated beverages. Avoid chocolate. Try eating 4-6 small meals a day rather than 3 large meals. Do not eat within 3 hours of laying down. Prop head of bed up on wood or bricks to create a 6 inch incline.  If you continue to have chest discomfort when laying down at night, we can try adding an acid reflux medication in the evening.   We will see you back in 3 months to re-evaluate reflux and discuss scheduling a colonoscopy providing cardiology has completed their work-up.   Ermalinda Memos, PA-C [facility]

## 2023-05-14 ENCOUNTER — Encounter: Payer: Self-pay | Admitting: Gastroenterology

## 2023-06-17 ENCOUNTER — Other Ambulatory Visit (INDEPENDENT_AMBULATORY_CARE_PROVIDER_SITE_OTHER): Payer: Self-pay

## 2023-06-17 ENCOUNTER — Ambulatory Visit: Admitting: Orthopedic Surgery

## 2023-06-17 VITALS — BP 152/100 | HR 84 | Ht 69.0 in | Wt 138.4 lb

## 2023-06-17 DIAGNOSIS — M545 Low back pain, unspecified: Secondary | ICD-10-CM

## 2023-06-17 DIAGNOSIS — M79604 Pain in right leg: Secondary | ICD-10-CM

## 2023-06-17 DIAGNOSIS — M79605 Pain in left leg: Secondary | ICD-10-CM | POA: Diagnosis not present

## 2023-06-17 MED ORDER — IBUPROFEN 600 MG PO TABS: 600.0000 mg | ORAL_TABLET | Freq: Four times a day (QID) | ORAL | 0 refills | Status: AC | PRN

## 2023-06-17 MED ORDER — CYCLOBENZAPRINE HCL 10 MG PO TABS: 10.0000 mg | ORAL_TABLET | Freq: Two times a day (BID) | ORAL | 0 refills | Status: AC | PRN

## 2023-06-17 NOTE — Addendum Note (Signed)
 Addended by: Marti Slates on: 06/17/2023 09:06 AM   Modules accepted: Orders

## 2023-06-17 NOTE — Progress Notes (Signed)
 New Patient Visit  Assessment: Tim Hahn is a 43 y.o. male with the following: 1. Lumbar pain with radiation down both legs  Plan: Tim Hahn is complaining of primarily axial back pain.  Radiographs are negative.  Based on today's imaging, he does not have scoliosis.  Thus far, he has tried very little to improve the symptoms in his back.  I have recommended ibuprofen , as well as a muscle relaxer.  These were prescribed for him today.  In addition, I would like him to work with physical therapy.  If he continues to have issues, we can discuss further treatment, including an MRI.  He states understanding.  He will follow-up as needed.  Follow-up: Return if symptoms worsen or fail to improve.  Subjective:  Chief Complaint  Patient presents with   Back Pain    Pt with years of back pain w/ radiation down legs getting worse over time. States he has locking in place and a history of scoliosis.     History of Present Illness: Tim Hahn is a 43 y.o. male who has been referred by  Mindi Alto, MD for evaluation of low back pain.  He states has had pain in his lower back for several years.  No specific injury.  It continues to get worse.  He is not taking medications.  He has tried prednisone once, but this did not provide sustained relief.  He has not worked with physical therapy.  He states his back will occasionally "give out".  Occasionally, he will have some pains radiating into his legs.  He denies numbness and tingling.   Review of Systems: No fevers or chills No numbness or tingling No chest pain No shortness of breath No bowel or bladder dysfunction No GI distress No headaches   Medical History:  Past Medical History:  Diagnosis Date   Anxiety    Depression    GERD (gastroesophageal reflux disease)    Insomnia    Scoliosis of thoracic spine     No past surgical history on file.  Family History  Problem Relation Age of Onset   Colon cancer Neg Hx     Social History   Tobacco Use   Smoking status: Every Day    Types: Cigarettes   Smokeless tobacco: Never  Vaping Use   Vaping status: Never Used  Substance Use Topics   Alcohol use: Yes    Comment: every other day   Drug use: Yes    Types: Marijuana, Cocaine    Comment: daily    No Known Allergies  Current Meds  Medication Sig   cyclobenzaprine (FLEXERIL) 10 MG tablet Take 1 tablet (10 mg total) by mouth 2 (two) times daily as needed.   ibuprofen  (ADVIL ) 600 MG tablet Take 1 tablet (600 mg total) by mouth every 6 (six) hours as needed.   ALPRAZolam (XANAX) 0.5 MG tablet Take 0.5 mg by mouth daily.   pantoprazole  (PROTONIX ) 40 MG tablet TAKE (1) TABLET A DAY.   VRAYLAR 3 MG capsule Take 3 mg by mouth daily.    Objective: BP (!) 152/100   Pulse 84   Ht 5\' 9"  (1.753 m)   Wt 138 lb 6.4 oz (62.8 kg)   BMI 20.44 kg/m   Physical Exam:  General: Alert and oriented. and No acute distress. Gait: Slow, steady gait.  Evaluation of low back demonstrates no deformity.  No redness.  No evidence of curvature.  Sensation is intact in bilateral lower extremities.  Negative straight leg raise bilaterally.  Strength is equal bilaterally.  2+ patellar tendon reflexes.  IMAGING: I personally ordered and reviewed the following images   Standing lumbar spine x-rays were obtained in clinic today.  No acute injuries are noted.  No evidence of a curvature.  No anterolisthesis.  Well-maintained disc height.  Minimal degenerative changes.  No bony lesions.  Impression: Negative lumbar spine x-rays   New Medications:  Meds ordered this encounter  Medications   ibuprofen  (ADVIL ) 600 MG tablet    Sig: Take 1 tablet (600 mg total) by mouth every 6 (six) hours as needed.    Dispense:  30 tablet    Refill:  0   cyclobenzaprine (FLEXERIL) 10 MG tablet    Sig: Take 1 tablet (10 mg total) by mouth 2 (two) times daily as needed.    Dispense:  30 tablet    Refill:  0      Tonita Frater,  MD  06/17/2023 9:02 AM

## 2023-06-24 ENCOUNTER — Ambulatory Visit: Attending: Orthopedic Surgery

## 2023-06-24 DIAGNOSIS — M79605 Pain in left leg: Secondary | ICD-10-CM | POA: Diagnosis not present

## 2023-06-24 DIAGNOSIS — M5416 Radiculopathy, lumbar region: Secondary | ICD-10-CM | POA: Diagnosis present

## 2023-06-24 DIAGNOSIS — M6281 Muscle weakness (generalized): Secondary | ICD-10-CM | POA: Insufficient documentation

## 2023-06-24 DIAGNOSIS — M79604 Pain in right leg: Secondary | ICD-10-CM | POA: Diagnosis not present

## 2023-06-24 DIAGNOSIS — M545 Low back pain, unspecified: Secondary | ICD-10-CM | POA: Insufficient documentation
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
## 2023-07-10 ENCOUNTER — Encounter
